# Patient Record
Sex: Female | Born: 1985 | Race: Black or African American | Hispanic: No | Marital: Single | Smoking: Former smoker
Health system: Southern US, Community
[De-identification: ages and names within clinical notes are randomized; demographics above are authoritative.]

## PROBLEM LIST (undated history)

## (undated) DIAGNOSIS — G510 Bell's palsy: Secondary | ICD-10-CM

## (undated) DIAGNOSIS — K219 Gastro-esophageal reflux disease without esophagitis: Secondary | ICD-10-CM

## (undated) HISTORY — DX: Morbid (severe) obesity due to excess calories: E66.01

## (undated) HISTORY — PX: FOOT SURGERY: SHX648

---

## 2008-12-14 ENCOUNTER — Emergency Department (HOSPITAL_COMMUNITY): Admission: EM | Admit: 2008-12-14 | Discharge: 2008-12-14 | Payer: Self-pay | Admitting: Emergency Medicine

## 2009-01-19 ENCOUNTER — Emergency Department (HOSPITAL_COMMUNITY): Admission: EM | Admit: 2009-01-19 | Discharge: 2009-01-19 | Payer: Self-pay | Admitting: Emergency Medicine

## 2010-07-25 ENCOUNTER — Emergency Department (HOSPITAL_COMMUNITY)
Admission: EM | Admit: 2010-07-25 | Discharge: 2010-07-25 | Disposition: A | Discharge status: Home / Self Care | Payer: Self-pay | Attending: Emergency Medicine | Admitting: Emergency Medicine

## 2010-07-25 DIAGNOSIS — J029 Acute pharyngitis, unspecified: Secondary | ICD-10-CM | POA: Insufficient documentation

## 2010-07-25 DIAGNOSIS — F17200 Nicotine dependence, unspecified, uncomplicated: Secondary | ICD-10-CM | POA: Insufficient documentation

## 2010-07-25 DIAGNOSIS — K089 Disorder of teeth and supporting structures, unspecified: Secondary | ICD-10-CM | POA: Insufficient documentation

## 2010-12-05 ENCOUNTER — Encounter: Payer: Self-pay | Admitting: *Deleted

## 2010-12-05 ENCOUNTER — Emergency Department (HOSPITAL_COMMUNITY)
Admission: EM | Admit: 2010-12-05 | Discharge: 2010-12-05 | Disposition: A | Discharge status: Home / Self Care | Payer: Self-pay | Attending: Emergency Medicine | Admitting: Emergency Medicine

## 2010-12-05 DIAGNOSIS — J40 Bronchitis, not specified as acute or chronic: Secondary | ICD-10-CM | POA: Insufficient documentation

## 2010-12-05 DIAGNOSIS — R509 Fever, unspecified: Secondary | ICD-10-CM | POA: Insufficient documentation

## 2010-12-05 DIAGNOSIS — J029 Acute pharyngitis, unspecified: Secondary | ICD-10-CM | POA: Insufficient documentation

## 2010-12-05 DIAGNOSIS — F17200 Nicotine dependence, unspecified, uncomplicated: Secondary | ICD-10-CM | POA: Insufficient documentation

## 2010-12-05 DIAGNOSIS — R059 Cough, unspecified: Secondary | ICD-10-CM | POA: Insufficient documentation

## 2010-12-05 DIAGNOSIS — R05 Cough: Secondary | ICD-10-CM | POA: Insufficient documentation

## 2010-12-05 LAB — RAPID STREP SCREEN (MED CTR MEBANE ONLY): Streptococcus, Group A Screen (Direct): NEGATIVE

## 2010-12-05 MED ORDER — GUAIFENESIN-CODEINE 100-10 MG/5ML PO SYRP: ORAL_SOLUTION | ORAL | Status: DC

## 2010-12-05 NOTE — ED Notes (Signed)
Pt c/o nasal congestion, non productive cough, sore throat, and chest pain with coughing x 2 days. States that her nasal discharge is yellow. Pt alert and oriented x 3. Skin warm and dry. Color pink. Breath sounds clear and equal bilaterally.

## 2010-12-05 NOTE — ED Provider Notes (Signed)
History     CSN: [id number] Arrival date & time: 12/05/2010  9:49 AM  Chief Complaint  Patient presents with  . Sore Throat    (Consider location/radiation/quality/duration/timing/severity/associated sxs/prior treatment) HPI Comments: Pt has had cough x 2 days but refuses a chest xray.  Patient is a 25 y.o. female presenting with pharyngitis. The history is provided by the patient. No language interpreter was used.  Sore Throat This is a new problem. Episode onset: 2 days ago. The problem occurs constantly. The problem has been unchanged. Associated symptoms include coughing, a fever and a sore throat. Associated symptoms comments: Prod cough. The symptoms are aggravated by coughing and swallowing. She has tried acetaminophen (cough medicine) for the symptoms. The treatment provided mild relief.  Sore Throat This is a new problem. Episode onset: 2 days ago. The problem occurs constantly. The problem has been unchanged. Associated symptoms comments: Prod cough. The symptoms are aggravated by coughing and swallowing. She has tried acetaminophen (cough medicine) for the symptoms. The treatment provided mild relief.    History reviewed. No pertinent past medical history.  History reviewed. No pertinent past surgical history.  History reviewed. No pertinent family history.  History  Substance Use Topics  . Smoking status: Current Everyday Smoker    Types: Cigarettes  . Smokeless tobacco: Not on file  . Alcohol Use: Yes     weekly    OB History    Grav Para Term Preterm Abortions TAB SAB Ect Mult Living                  Review of Systems  Constitutional: Positive for fever.  HENT: Positive for sore throat.   Respiratory: Positive for cough. Negative for wheezing.   All other systems reviewed and are negative.    Allergies  Tramadol  Home Medications  No current outpatient prescriptions on file.  BP 120/83  Pulse 97  Temp(Src) 98.7 F (37.1 C) (Oral)  Resp 18  Ht  5\' 9"  (1.753 m)  Wt 265 lb (120.203 kg)  BMI 39.13 kg/m2  SpO2 99%  LMP 11/22/2010  Physical Exam  Nursing note and vitals reviewed. Constitutional: She is oriented to person, place, and time. Vital signs are normal. She appears well-developed and well-nourished. She is cooperative. No distress.  HENT:  Head: Normocephalic and atraumatic. No trismus in the jaw.  Right Ear: External ear normal.  Left Ear: External ear normal.  Nose: Nose normal.  Mouth/Throat: Mucous membranes are normal. No oral lesions. No uvula swelling. Posterior oropharyngeal erythema present. No oropharyngeal exudate, posterior oropharyngeal edema or tonsillar abscesses.       Pharynx is erythematous.  No tonsillar swelling.  Eyes: Conjunctivae and EOM are normal. Pupils are equal, round, and reactive to light. Right eye exhibits no discharge. Left eye exhibits no discharge. No scleral icterus.  Neck: Normal range of motion. Neck supple. No JVD present. No tracheal deviation present. No thyromegaly present.  Cardiovascular: Normal rate, regular rhythm, normal heart sounds, intact distal pulses and normal pulses.  Exam reveals no gallop and no friction rub.   No murmur heard. Pulmonary/Chest: Effort normal and breath sounds normal. No stridor. No respiratory distress. She has no decreased breath sounds. She has no wheezes. She has no rales. She exhibits no tenderness.  Abdominal: Soft. Normal appearance and bowel sounds are normal. She exhibits no distension and no mass. There is no tenderness. There is no rebound and no guarding.  Musculoskeletal: Normal range of motion. She exhibits no edema  and no tenderness.  Lymphadenopathy:    She has no cervical adenopathy.  Neurological: She is alert and oriented to person, place, and time. She has normal reflexes. Coordination normal. GCS eye subscore is 4. GCS verbal subscore is 5. GCS motor subscore is 6.  Skin: Skin is warm and dry. No rash noted. She is not diaphoretic.    Psychiatric: She has a normal mood and affect. Her speech is normal and behavior is normal. Judgment and thought content normal. Cognition and memory are normal.    ED Course  Procedures (including critical care time)   Labs Reviewed  RAPID STREP SCREEN   No results found.   No diagnosis found.  Medical screening examination/treatment/procedure(s) were performed by non-physician practitioner and as supervising physician I was immediately available for consultation/collaboration.   MDM          Worthy Rancher, PA 12/05/10 1122  Suzi Roots, MD 12/06/10 [phone]

## 2010-12-05 NOTE — Discharge Instructions (Signed)
Bronchitis Bronchitis is the body's way of reacting to injury and/or infection (inflammation) of the bronchi. Bronchi are the air tubes that extend from the windpipe into the lungs. If the inflammation becomes severe, it may cause shortness of breath.  CAUSES Inflammation may be caused by:  A virus.   Germs (bacteria).   Dust.   Allergens.   Pollutants and many other irritants.  The cells lining the bronchial tree are covered with tiny hairs (cilia). These constantly beat upward, away from the lungs, toward the mouth. This keeps the lungs free of pollutants. When these cells become too irritated and are unable to do their job, mucus begins to develop. This causes the characteristic cough of bronchitis. The cough clears the lungs when the cilia are unable to do their job. Without either of these protective mechanisms, the mucus would settle in the lungs. Then you would develop pneumonia. Smoking is a common cause of bronchitis and can contribute to pneumonia. Stopping this habit is the single most important thing you can do to help yourself. TREATMENT  Your caregiver may prescribe an antibiotic if the cough is caused by bacteria. Also, medicines that open up your airways make it easier to breathe. Your caregiver may also recommend or prescribe an expectorant. It will loosen the mucus to be coughed up. Only take over-the-counter or prescription medicines for pain, discomfort, or fever as directed by your caregiver.   Removing whatever causes the problem (smoking, for example) is critical to preventing the problem from getting worse.   Cough suppressants may be prescribed for relief of cough symptoms.   Inhaled medicines may be prescribed to help with symptoms now and to help prevent problems from returning.   For those with recurrent (chronic) bronchitis, there may be a need for steroid medicines.  SEEK IMMEDIATE MEDICAL CARE IF:  During treatment, you develop more pus-like mucus  (purulent sputum).   You or your child has an oral temperature above 101, not controlled by medicine.   Your baby is older than 3 months with a rectal temperature of 102 F (38.9 C) or higher.   Your baby is 5 months old or younger with a rectal temperature of 100.4 F (38 C) or higher.   You become progressively more ill.   You have increased difficulty breathing, wheezing, or shortness of breath.  It is necessary to seek immediate medical care if you are elderly or sick from any other disease. MAKE SURE YOU:  Understand these instructions.   Will watch your condition.   Will get help right away if you are not doing well or get worse.  Document Released: 02/12/2005 Document Re-Released: 05/09/2009 [facility] Patient Information 2011 [place], [place].Pharyngitis (Viral and Bacterial) Pharyngitis is soreness (inflammation) or infection of the pharynx. It is also called a sore throat. CAUSES Most sore throats are caused by viruses and are part of a cold. However, some sore throats are caused by strep and other bacteria. Sore throats can also be caused by post nasal drip from draining sinuses, allergies and sometimes from sleeping with an open mouth. Infectious sore throats can be spread from person to person by coughing, sneezing and sharing cups or eating utensils. TREATMENT Sore throats that are viral usually last 3-4 days. Viral illness will get better without medications (antibiotics). Strep throat and other bacterial infections will usually begin to get better about 24-48 hours after you begin to take antibiotics. HOME CARE INSTRUCTIONS  If the caregiver feels there is a bacterial infection or  if there is a positive strep test, they will prescribe an antibiotic. The full course of antibiotics must be taken!! If the full course of antibiotic is not taken, you or your child may become ill again. If you or your child has strep throat and do not finish all of the medication, serious heart  or kidney diseases may develop.   Drink enough water and fluids to keep your urine clear or pale yellow.   Only take over-the-counter or prescription medicines for pain, discomfort or fever as directed by your caregiver.   Get lots of rest.   Gargle with salt water ( tsp. of salt in a glass of water) as often as every 1-2 hours as you need for comfort.   Hard candies may soothe the throat if individual is not at risk for choking. Throat sprays or lozenges may also be used.  SEEK MEDICAL CARE IF:  Large, tender lumps in the neck develop.   A rash develops.   Green, yellow-brown or bloody sputum is coughed up.   You or your child has an oral temperature above 102 F (38.9 C).   Your baby is older than 3 months with a rectal temperature of 100.5 F (38.1 C) or higher for more than 1 day.  SEEK IMMEDIATE MEDICAL CARE IF:  A stiff neck develops.   You or your child are drooling or unable to swallow liquids.   You or your child are vomiting, unable to keep medications or liquids down.   You or your child has severe pain, unrelieved with recommended medications.   You or your child are having difficulty breathing (not due to stuffy nose).   You or your child are unable to fully open your mouth.   You or your child develop redness, swelling, or severe pain anywhere on the neck.   You or your child has an oral temperature above 102 F (38.9 C), not controlled by medicine.   Your baby is older than 3 months with a rectal temperature of 102 F (38.9 C) or higher.   Your baby is 63 months old or younger with a rectal temperature of 100.4 F (38 C) or higher.  MAKE SURE YOU:   Understand these instructions.   Will watch your condition.   Will get help right away if you are not doing well or get worse.  Document Released: 02/12/2005 Document Re-Released: 08/02/2009 [facility] [place] [facility] Patient Information 2011 [place], [place].       The strep screen is negative.  Clinically you  have a bronchitis.  Take tylenol up to 1000 mg every 4 hrs or ibuprofen up to 800 mg every 8 hrs for fever or discomfort.  Gargle frequently with salt water.  Chloraseptic will also help.  Take the cough medicine as diredred

## 2010-12-05 NOTE — ED Notes (Signed)
Pt c/o sore throat, cough, nasal congestion and chest pain with coughing x 2 days.

## 2011-02-05 ENCOUNTER — Encounter (HOSPITAL_COMMUNITY): Payer: Self-pay | Admitting: Emergency Medicine

## 2011-02-05 ENCOUNTER — Emergency Department (HOSPITAL_COMMUNITY)
Admission: EM | Admit: 2011-02-05 | Discharge: 2011-02-05 | Disposition: A | Discharge status: Home / Self Care | Payer: Self-pay | Attending: Emergency Medicine | Admitting: Emergency Medicine

## 2011-02-05 DIAGNOSIS — S63601A Unspecified sprain of right thumb, initial encounter: Secondary | ICD-10-CM

## 2011-02-05 DIAGNOSIS — S6390XA Sprain of unspecified part of unspecified wrist and hand, initial encounter: Secondary | ICD-10-CM | POA: Insufficient documentation

## 2011-02-05 DIAGNOSIS — J101 Influenza due to other identified influenza virus with other respiratory manifestations: Secondary | ICD-10-CM

## 2011-02-05 DIAGNOSIS — J111 Influenza due to unidentified influenza virus with other respiratory manifestations: Secondary | ICD-10-CM | POA: Insufficient documentation

## 2011-02-05 DIAGNOSIS — X58XXXA Exposure to other specified factors, initial encounter: Secondary | ICD-10-CM | POA: Insufficient documentation

## 2011-02-05 DIAGNOSIS — F17200 Nicotine dependence, unspecified, uncomplicated: Secondary | ICD-10-CM | POA: Insufficient documentation

## 2011-02-05 HISTORY — DX: Bell's palsy: G51.0

## 2011-02-05 MED ORDER — OSELTAMIVIR PHOSPHATE 75 MG PO CAPS: 75.0000 mg | ORAL_CAPSULE | Freq: Two times a day (BID) | ORAL | Status: AC

## 2011-02-05 NOTE — ED Notes (Signed)
Rick Miller, PA at bedside. 

## 2011-02-05 NOTE — ED Notes (Signed)
Patient also c/o right thumb pain after falling this morning.

## 2011-02-05 NOTE — ED Notes (Signed)
Patient c/o cough,  fever, nasal congestion, generalized pain, and bilateral ear pain since yesterday. Per patient fever 101 last night. Patient reports taking robitussin last night.

## 2011-02-05 NOTE — ED Provider Notes (Signed)
Medical screening examination/treatment/procedure(s) were performed by non-physician practitioner and as supervising physician I was immediately available for consultation/collaboration.   Benny Lennert, MD 02/05/11 [phone]

## 2011-02-05 NOTE — ED Notes (Signed)
Patient with no complaints at this time. Respirations even and unlabored. Skin warm/dry. Discharge instructions reviewed with patient at this time. Patient given opportunity to voice concerns/ask questions. Patient discharged at this time and left Emergency Department with steady gait.   

## 2011-02-05 NOTE — ED Provider Notes (Signed)
History     CSN: [id number] Arrival date & time: 02/05/2011  9:18 AM   First MD Initiated Contact with Patient 02/05/11 [phone]      Chief Complaint  Patient presents with  . URI    cough, congestion, sore throat, fevers  . Generalized Body Aches  . Otalgia    (Consider location/radiation/quality/duration/timing/severity/associated sxs/prior treatment) Patient is a 25 y.o. female presenting with URI and ear pain. The history is provided by the patient. No language interpreter was used.  URI The primary symptoms include fever, headaches, ear pain, sore throat, cough and myalgias. The current episode started yesterday. This is a new problem.  Symptoms associated with the illness include chills, congestion and rhinorrhea.  Otalgia Associated symptoms include headaches, rhinorrhea, sore throat and cough.    Past Medical History  Diagnosis Date  . Bell's palsy     Past Surgical History  Procedure Date  . Foot surgery     right    Family History  Problem Relation Age of Onset  . Thyroid disease Mother   . Cancer Mother     History  Substance Use Topics  . Smoking status: Current Everyday Smoker -- 0.5 packs/day for 8 years    Types: Cigarettes  . Smokeless tobacco: Never Used  . Alcohol Use: 2.4 oz/week    4 Cans of beer per week    OB History    Grav Para Term Preterm Abortions TAB SAB Ect Mult Living            0      Review of Systems  Constitutional: Positive for fever and chills.  HENT: Positive for ear pain, congestion, sore throat and rhinorrhea.   Respiratory: Positive for cough.   Musculoskeletal: Positive for myalgias.       Thumb injury.  Neurological: Positive for headaches.  All other systems reviewed and are negative.    Allergies  Tramadol  Home Medications   Current Outpatient Rx  Name Route Sig Dispense Refill  . GUAIFENESIN-CODEINE 100-10 MG/5ML PO SYRP  5-10 ml q 4 -6  Hrs prn cough 240 mL 0  . PSEUDOEPH-DOXYLAMINE-DM-APAP  60-7.07-25-998 MG/30ML PO LIQD Oral Take 30 mLs by mouth daily as needed. For cough       BP 116/76  Pulse 86  Temp(Src) 98.4 F (36.9 C) (Oral)  Resp 20  Ht 5\' 9"  (1.753 m)  Wt 260 lb (117.935 kg)  BMI 38.40 kg/m2  SpO2 100%  LMP 01/27/2011  Physical Exam  Nursing note and vitals reviewed. Constitutional: She is oriented to person, place, and time. She appears well-developed and well-nourished. She is cooperative. She appears ill. No distress.  HENT:  Head: Normocephalic and atraumatic.  Nose: Rhinorrhea present.  Eyes: EOM are normal.  Neck: Trachea normal, normal range of motion and phonation normal. Normal carotid pulses, no hepatojugular reflux and no JVD present. No tracheal tenderness present. Carotid bruit is not present. No tracheal deviation present.  Cardiovascular: Normal rate, regular rhythm and normal heart sounds.   Pulmonary/Chest: Effort normal and breath sounds normal. No accessory muscle usage or stridor. Not tachypneic. No respiratory distress. She has no decreased breath sounds. She has no wheezes. She has no rhonchi. She has no rales.  Abdominal: Soft. She exhibits no distension. There is no tenderness.  Musculoskeletal: She exhibits tenderness.       Right shoulder: She exhibits decreased range of motion, tenderness, bony tenderness and pain. She exhibits no swelling, no crepitus, no deformity and no laceration.  Right hand: She exhibits decreased range of motion, tenderness and bony tenderness. She exhibits no laceration and no swelling.       Hands: Neurological: She is alert and oriented to person, place, and time.  Skin: Skin is warm and dry. She is not diaphoretic.  Psychiatric: She has a normal mood and affect. Judgment normal.    ED Course  Procedures (including critical care time)  Labs Reviewed - No data to display No results found.   No diagnosis found.    MDM  Pt refuses xrays of her R thumb.  She simply wants a  brace.        Worthy Rancher, PA 02/05/11 [phone]

## 2013-05-28 ENCOUNTER — Ambulatory Visit (INDEPENDENT_AMBULATORY_CARE_PROVIDER_SITE_OTHER): Payer: 59 | Admitting: Family Medicine

## 2013-05-28 ENCOUNTER — Encounter (INDEPENDENT_AMBULATORY_CARE_PROVIDER_SITE_OTHER): Payer: Self-pay

## 2013-05-28 ENCOUNTER — Telehealth: Payer: Self-pay | Admitting: Family Medicine

## 2013-05-28 ENCOUNTER — Encounter: Payer: Self-pay | Admitting: Family Medicine

## 2013-05-28 VITALS — BP 116/77 | HR 87 | Temp 98.4°F | Ht 68.0 in | Wt 292.0 lb

## 2013-05-28 DIAGNOSIS — R3915 Urgency of urination: Secondary | ICD-10-CM

## 2013-05-28 DIAGNOSIS — N390 Urinary tract infection, site not specified: Secondary | ICD-10-CM

## 2013-05-28 DIAGNOSIS — A499 Bacterial infection, unspecified: Secondary | ICD-10-CM

## 2013-05-28 DIAGNOSIS — N760 Acute vaginitis: Secondary | ICD-10-CM

## 2013-05-28 DIAGNOSIS — B9689 Other specified bacterial agents as the cause of diseases classified elsewhere: Secondary | ICD-10-CM

## 2013-05-28 LAB — POCT URINALYSIS DIPSTICK
Bilirubin, UA: NEGATIVE
Glucose, UA: NEGATIVE
Ketones, UA: NEGATIVE
Nitrite, UA: NEGATIVE
Protein, UA: NEGATIVE
Spec Grav, UA: >=1.03
Urobilinogen, UA: NEGATIVE
pH, UA: 6.5

## 2013-05-28 LAB — POCT UA - MICROSCOPIC ONLY
Casts, Ur, LPF, POC: NEGATIVE
Crystals, Ur, HPF, POC: NEGATIVE
Mucus, UA: NEGATIVE
Yeast, UA: NEGATIVE

## 2013-05-28 MED ORDER — CIPROFLOXACIN HCL 500 MG PO TABS: 500.0000 mg | ORAL_TABLET | Freq: Two times a day (BID) | ORAL | Status: DC

## 2013-05-28 MED ORDER — METRONIDAZOLE 0.75 % VA GEL: VAGINAL | Status: DC

## 2013-05-28 NOTE — Telephone Encounter (Signed)
appt today at 3:30.

## 2013-05-28 NOTE — Progress Notes (Signed)
   Subjective:    Patient ID: Elizabeth Luna, female    DOB: 07-04-1985, 28 y.o.   MRN: [id number]  HPI C/o urinary frequency and urgency. She has been having some sx's for 2 days now.   Review of Systems C/o urinary frequency and urgency. No chest pain, SOB, HA, dizziness, vision change, N/V, diarrhea, constipation, myalgias, arthralgias or rash.     Objective:   Physical Exam  Vital signs noted  Well developed well nourished female.  HEENT - Head atraumatic Normocephalic                Eyes - PERRLA, Conjuctiva - clear Sclera- Clear EOMI                Ears - EAC's Wnl TM's Wnl Gross Hearing WNL                 Throat - oropharanx wnl Respiratory - Lungs CTA bilateral Cardiac - RRR S1 and S2 without murmur GI - Abdomen soft Nontender and bowel sounds active x 4  Results for orders placed in visit on 05/28/13  POCT UA - MICROSCOPIC ONLY      Result Value Ref Range   WBC, Ur, HPF, POC 5-10w/ clue cells     RBC, urine, microscopic 1-5     Bacteria, U Microscopic mod     Mucus, UA neg     Epithelial cells, urine per micros mod     Crystals, Ur, HPF, POC neg     Casts, Ur, LPF, POC neg     Yeast, UA neg    POCT URINALYSIS DIPSTICK      Result Value Ref Range   Color, UA yellow     Clarity, UA clear     Glucose, UA neg     Bilirubin, UA neg     Ketones, UA neg     Spec Grav, UA >=1.030     Blood, UA trace     pH, UA 6.5     Protein, UA neg     Urobilinogen, UA negative     Nitrite, UA neg     Leukocytes, UA Trace          Assessment & Plan:  Urinary urgency - Plan: POCT UA - Microscopic Only, POCT urinalysis dipstick, ciprofloxacin (CIPRO) 500 MG tablet, Urine culture, metroNIDAZOLE (METROGEL VAGINAL) 0.75 % vaginal gel  UTI (lower urinary tract infection) - Plan: ciprofloxacin (CIPRO) 500 MG tablet, Urine culture, metroNIDAZOLE (METROGEL VAGINAL) 0.75 % vaginal gel  Bacterial vaginitis - Plan: metroNIDAZOLE (METROGEL VAGINAL) 0.75 % vaginal gel  Deatra CanterWilliam J  Sarah Baez FNP

## 2013-05-29 LAB — URINE CULTURE

## 2013-06-19 ENCOUNTER — Telehealth: Payer: Self-pay | Admitting: Family Medicine

## 2013-06-19 ENCOUNTER — Ambulatory Visit: Payer: 59 | Admitting: Family Medicine

## 2013-06-19 NOTE — Telephone Encounter (Signed)
Appt scheduled and patient aware.  

## 2013-06-20 ENCOUNTER — Ambulatory Visit (INDEPENDENT_AMBULATORY_CARE_PROVIDER_SITE_OTHER): Payer: 59 | Admitting: Family Medicine

## 2013-06-20 ENCOUNTER — Emergency Department (HOSPITAL_COMMUNITY)
Admission: EM | Admit: 2013-06-20 | Discharge: 2013-06-20 | Disposition: A | Discharge status: Home / Self Care | Payer: 59 | Attending: Emergency Medicine | Admitting: Emergency Medicine

## 2013-06-20 ENCOUNTER — Encounter: Payer: Self-pay | Admitting: Family Medicine

## 2013-06-20 ENCOUNTER — Encounter (HOSPITAL_COMMUNITY): Payer: Self-pay | Admitting: Emergency Medicine

## 2013-06-20 ENCOUNTER — Emergency Department (HOSPITAL_COMMUNITY): Payer: 59

## 2013-06-20 VITALS — BP 137/66 | HR 79 | Temp 97.2°F | Ht 68.0 in | Wt 292.0 lb

## 2013-06-20 DIAGNOSIS — Z3202 Encounter for pregnancy test, result negative: Secondary | ICD-10-CM | POA: Insufficient documentation

## 2013-06-20 DIAGNOSIS — R110 Nausea: Secondary | ICD-10-CM | POA: Insufficient documentation

## 2013-06-20 DIAGNOSIS — Z8669 Personal history of other diseases of the nervous system and sense organs: Secondary | ICD-10-CM | POA: Insufficient documentation

## 2013-06-20 DIAGNOSIS — F17200 Nicotine dependence, unspecified, uncomplicated: Secondary | ICD-10-CM | POA: Insufficient documentation

## 2013-06-20 DIAGNOSIS — R1011 Right upper quadrant pain: Secondary | ICD-10-CM | POA: Insufficient documentation

## 2013-06-20 DIAGNOSIS — M549 Dorsalgia, unspecified: Secondary | ICD-10-CM | POA: Insufficient documentation

## 2013-06-20 DIAGNOSIS — K812 Acute cholecystitis with chronic cholecystitis: Secondary | ICD-10-CM | POA: Insufficient documentation

## 2013-06-20 DIAGNOSIS — R109 Unspecified abdominal pain: Secondary | ICD-10-CM

## 2013-06-20 HISTORY — DX: Gastro-esophageal reflux disease without esophagitis: K21.9

## 2013-06-20 LAB — COMPREHENSIVE METABOLIC PANEL
ALK PHOS: 86 U/L (ref 39–117)
ALT: 12 U/L (ref 0–35)
AST: 13 U/L (ref 0–37)
Albumin: 4 g/dL (ref 3.5–5.2)
BILIRUBIN TOTAL: 0.4 mg/dL (ref 0.3–1.2)
BUN: 10 mg/dL (ref 6–23)
CHLORIDE: 102 meq/L (ref 96–112)
CO2: 25 meq/L (ref 19–32)
CREATININE: 0.65 mg/dL (ref 0.50–1.10)
Calcium: 9.5 mg/dL (ref 8.4–10.5)
GFR calc Af Amer: >90 mL/min (ref >90)
Glucose, Bld: 86 mg/dL (ref 70–99)
POTASSIUM: 4.3 meq/L (ref 3.7–5.3)
Sodium: 139 mEq/L (ref 137–147)
Total Protein: 7.3 g/dL (ref 6.0–8.3)

## 2013-06-20 LAB — CBC WITH DIFFERENTIAL/PLATELET
Basophils Absolute: 0.1 10*3/uL (ref 0.0–0.1)
Basophils Relative: 2 % — ABNORMAL HIGH (ref 0–1)
Eosinophils Absolute: 0.2 10*3/uL (ref 0.0–0.7)
Eosinophils Relative: 5 % (ref 0–5)
HEMATOCRIT: 39.9 % (ref 36.0–46.0)
HEMOGLOBIN: 13.7 g/dL (ref 12.0–15.0)
LYMPHS ABS: 1.6 10*3/uL (ref 0.7–4.0)
LYMPHS PCT: 34 % (ref 12–46)
MCH: 29.7 pg (ref 26.0–34.0)
MCHC: 34.3 g/dL (ref 30.0–36.0)
MCV: 86.4 fL (ref 78.0–100.0)
MONO ABS: 0.6 10*3/uL (ref 0.1–1.0)
MONOS PCT: 13 % — AB (ref 3–12)
NEUTROS ABS: 2.2 10*3/uL (ref 1.7–7.7)
Neutrophils Relative %: 46 % (ref 43–77)
Platelets: 299 10*3/uL (ref 150–400)
RBC: 4.62 MIL/uL (ref 3.87–5.11)
RDW: 14 % (ref 11.5–15.5)
WBC: 4.6 10*3/uL (ref 4.0–10.5)

## 2013-06-20 LAB — PREGNANCY, URINE: Preg Test, Ur: NEGATIVE

## 2013-06-20 LAB — URINALYSIS, ROUTINE W REFLEX MICROSCOPIC
Bilirubin Urine: NEGATIVE
GLUCOSE, UA: NEGATIVE mg/dL
Hgb urine dipstick: NEGATIVE
KETONES UR: NEGATIVE mg/dL
LEUKOCYTES UA: NEGATIVE
Nitrite: NEGATIVE
PH: 7 (ref 5.0–8.0)
Protein, ur: NEGATIVE mg/dL
SPECIFIC GRAVITY, URINE: 1.02 (ref 1.005–1.030)
Urobilinogen, UA: 0.2 mg/dL (ref 0.0–1.0)

## 2013-06-20 LAB — POCT URINALYSIS DIPSTICK
Bilirubin, UA: NEGATIVE
Blood, UA: NEGATIVE
Glucose, UA: NEGATIVE
Ketones, UA: NEGATIVE
Leukocytes, UA: NEGATIVE
Nitrite, UA: NEGATIVE
Protein, UA: NEGATIVE
Spec Grav, UA: 1.015
Urobilinogen, UA: 0.2
pH, UA: 5

## 2013-06-20 LAB — LIPASE, BLOOD: LIPASE: 22 U/L (ref 11–59)

## 2013-06-20 MED ORDER — PROMETHAZINE HCL 12.5 MG PO TABS: 12.5000 mg | ORAL_TABLET | Freq: Four times a day (QID) | ORAL | Status: DC | PRN

## 2013-06-20 MED ORDER — HYDROCODONE-ACETAMINOPHEN 5-325 MG PO TABS: 1.0000 | ORAL_TABLET | Freq: Four times a day (QID) | ORAL | Status: DC | PRN

## 2013-06-20 MED ORDER — FAMOTIDINE 20 MG PO TABS: 20.0000 mg | ORAL_TABLET | Freq: Two times a day (BID) | ORAL | Status: DC

## 2013-06-20 NOTE — ED Notes (Signed)
Pt c/o mid abd pain that was intermittent at first but has remained constant for the past week, worse after eating requiring her to have a bowel movement immediatly after eating and nausea, was seen by her PCP today and was told to come to er for further work up, pt was eating chips in waiting room when RN called pt for triage, importance of not eating explained to pt and pt expressed understanding,

## 2013-06-20 NOTE — ED Provider Notes (Signed)
Medical screening examination/treatment/procedure(s) were conducted as a shared visit with non-physician practitioner(s) and myself.  I personally evaluated the patient during the encounter.   EKG Interpretation None     No acute abdomen. Ultrasound reveals numerous gallstones. Will recommend surgical followup your  Donnetta HutchingBrian Torah Pinnock, MD 06/20/13 [phone]

## 2013-06-20 NOTE — Progress Notes (Signed)
Patient ID: Elizabeth Luna, female   DOB: 08-19-1985, 28 y.Luna.   MRN: [id number] SUBJECTIVE: CC: Chief Complaint  Patient presents with  . Acute Visit    was seen on 05-28-13 by Elizabeth Luna for UTI completed antibiotic  c/Luna abd pain and says when get jup from chair and straightens up have pain "really bad mid epigastric area  and when eats "I have to go straight to bathroom     HPI: Symptoms as above.symptoms started a while ago and comes and goes.has to walk bent over. Works 3 jobs.last 1 week it got worse.when she eats she immediately has to go to the bathroom for a BM. It comes out like water.has had acid reflux . Sometime the acid reflux is bad.having bad pain yesterday at work.  Past Medical History  Diagnosis Date  . Bell's palsy    Past Surgical History  Procedure Laterality Date  . Foot surgery      right   History   Social History  . Marital Status: Single    Spouse Name: N/A    Number of Children: N/A  . Years of Education: N/A   Occupational History  . Not on file.   Social History Main Topics  . Smoking status: Current Every Day Smoker -- 0.50 packs/day for 8 years    Types: Cigarettes  . Smokeless tobacco: Never Used  . Alcohol Use: 2.4 oz/week    4 Cans of beer per week  . Drug Use: 1.00 per week    Special: Marijuana  . Sexual Activity: Yes    Birth Control/ Protection: None   Other Topics Concern  . Not on file   Social History Narrative  . No narrative on file   Family History  Problem Relation Age of Onset  . Thyroid disease Mother   . Cancer Mother    Current Outpatient Prescriptions on File Prior to Visit  Medication Sig Dispense Refill  . ciprofloxacin (CIPRO) 500 MG tablet Take 1 tablet (500 mg total) by mouth 2 (two) times daily.  14 tablet  0  . metroNIDAZOLE (METROGEL VAGINAL) 0.75 % vaginal gel Place one applicator vaginally qhs x 5 days  70 g  0   No current facility-administered medications on file prior to visit.   Allergies  Allergen  Reactions  . Tramadol     There is no immunization history on file for this patient. Prior to Admission medications   Medication Sig Start Date End Date Taking? Authorizing Provider  ciprofloxacin (CIPRO) 500 MG tablet Take 1 tablet (500 mg total) by mouth 2 (two) times daily. 05/28/13   Elizabeth CanterWilliam J Oxford, Elizabeth Luna  metroNIDAZOLE (METROGEL VAGINAL) 0.75 % vaginal gel Place one applicator vaginally qhs x 5 days 05/28/13   Elizabeth CanterWilliam J Oxford, Elizabeth Luna     ROS: As above in the HPI. All other systems are stable or negative.  OBJECTIVE: APPEARANCE:  Patient in no acute distress.The patient appeared well nourished and normally developed. Acyanotic. Waist: VITAL SIGNS:BP 137/66  Pulse 79  Temp(Src) 97.2 F (36.2 C) (Oral)  Ht 5\' 8"  (1.727 m)  Wt 292 lb (132.45 kg)  BMI 44.41 kg/m2  LMP 06/15/2013 [name] Morbidly obese.   SKIN: warm and  Dry without overt rashes, tattoos and scars  HEAD and Neck: without JVD, Head and scalp: normal Eyes:No scleral icterus. Fundi normal, eye movements normal. Ears: Auricle normal, canal normal, Tympanic membranes normal, insufflation normal. Nose: normal Throat: normal Neck & thyroid: normal  CHEST & LUNGS:  Chest wall: normal Lungs: Clear  CVS: Reveals the PMI to be normally located. Regular rhythm, First and Second Heart sounds are normal,  absence of murmurs, rubs or gallops. Peripheral vasculature: Radial pulses: normal Dorsal pedis pulses: normal Posterior pulses: normal  ABDOMEN:  Appearance: obese Very tender in the epigastrium,RUQ and umbilicus.mostly tender exquisitely at the umbilicus. , no organomegaly, no masses, no Abdominal Aortic enlargement. No Guarding , no rebound. No Bruits. Bowel sounds: normal  RECTAL: deferred. GU: N/A  EXTREMETIES: nonedematous.  MUSCULOSKELETAL:  Spine: normal Joints: intact  NEUROLOGIC: oriented to time,place and person; nonfocal.  ASSESSMENT: Abdominal pain, unspecified site - Plan: POCT  urinalysis dipstick Etiology to be determined. Pain is  Significant where patient has to walk slightly flexed forward. Suspect possible causes as GB disease vs PUD vs pancreatitis vs colitis.  PLAN: [facility]  Orders Placed This Encounter  Procedures  . POCT urinalysis dipstick   Results for orders placed in visit on 06/20/13  POCT URINALYSIS DIPSTICK      Result Value Ref Range   Color, UA yellow     Clarity, UA clear     Glucose, UA NEG     Bilirubin, UA NEG     Ketones, UA NEG     Spec Grav, UA 1.015     Blood, UA NEG     pH, UA 5.0     Protein, UA NEG     Urobilinogen, UA 0.2     Nitrite, UA NEG     Leukocytes, UA Negative       No orders of the defined types were placed in this encounter.   There are no discontinued medications. [facility]  Hudson Lehmkuhl P. Modesto CharonWong, M.D.

## 2013-06-20 NOTE — ED Provider Notes (Signed)
CSN: [id number]     Arrival date & time 06/20/13  0910 History   First MD Initiated Contact with Patient 06/20/13 0912     Chief Complaint  Patient presents with  . Abdominal Pain     (Consider location/radiation/quality/duration/timing/severity/associated sxs/prior Treatment) Patient is a 28 y.o. female presenting with abdominal pain. The history is provided by the patient.  Abdominal Pain Pain location:  Epigastric Pain quality: aching and bloating   Associated symptoms: nausea   Associated symptoms: no chest pain, no chills, no cough, no dysuria, no fever, no shortness of breath and no vomiting    Elizabeth Luna is a 28 y.o. feamele who presents to the ED with abdominal pain that started 2 weeks ago. Initially the pain would come and go but the past week the pain is constant. The pain is located in above the umbilicus. The pain increases with eating and radiates to the back. The pain is worse after eating burgers, fries or other fried food. She has had nausea but no vomiting.  Past Medical History  Diagnosis Date  . Bell's palsy   . GERD (gastroesophageal reflux disease)    Past Surgical History  Procedure Laterality Date  . Foot surgery      right   Family History  Problem Relation Age of Onset  . Thyroid disease Mother   . Cancer Mother    History  Substance Use Topics  . Smoking status: Current Every Day Smoker -- 0.50 packs/day for 8 years    Types: Cigarettes  . Smokeless tobacco: Never Used  . Alcohol Use: 2.4 oz/week    4 Cans of beer per week   OB History   Grav Para Term Preterm Abortions TAB SAB Ect Mult Living            0     Review of Systems  Constitutional: Negative for fever and chills.  HENT: Negative.   Eyes: Negative for visual disturbance.  Respiratory: Negative for cough, shortness of breath and wheezing.   Cardiovascular: Negative for chest pain.  Gastrointestinal: Positive for nausea and abdominal pain. Negative for vomiting.   Genitourinary: Negative for dysuria, urgency and frequency.  Musculoskeletal: Positive for back pain.  Skin: Negative for rash.  Neurological: Negative for syncope and headaches.  Psychiatric/Behavioral: Negative for confusion. The patient is not nervous/anxious.       Allergies  Tramadol  Home Medications   Prior to Admission medications   Medication Sig Start Date End Date Taking? Authorizing Provider  acetaminophen (TYLENOL) 500 MG tablet Take 1,000 mg by mouth every 6 (six) hours as needed for mild pain.   Yes Historical Provider, MD  ibuprofen (ADVIL,MOTRIN) 200 MG tablet Take 800 mg by mouth every 6 (six) hours as needed for moderate pain.   Yes Historical Provider, MD   BP 117/79  Pulse 80  Temp(Src) 98.3 F (36.8 C) (Oral)  Resp 21  Ht 5\' 7"  (1.702 m)  Wt 291 lb (131.997 kg)  BMI 45.57 kg/m2  SpO2 99%  LMP 06/15/2013 Physical Exam  Nursing note and vitals reviewed. Constitutional: She is oriented to person, place, and time. She appears well-developed and well-nourished. No distress.  HENT:  Head: Atraumatic.  Eyes: Conjunctivae and EOM are normal.  Neck: Neck supple.  Cardiovascular: Normal rate, regular rhythm and normal heart sounds.   Pulmonary/Chest: Effort normal.  Abdominal: Soft. Bowel sounds are normal. There is tenderness in the right upper quadrant and epigastric area. There is no rebound, no guarding  and no CVA tenderness.  Musculoskeletal: Normal range of motion.  Neurological: She is alert and oriented to person, place, and time. No cranial nerve deficit.  Skin: Skin is warm and dry.  Psychiatric: She has a normal mood and affect. Her behavior is normal.   Results for orders placed during the hospital encounter of 06/20/13 (from the past 24 hour(s))  PREGNANCY, URINE     Status: None   Collection Time    06/20/13  9:40 AM      Result Value Ref Range   Preg Test, Ur NEGATIVE  NEGATIVE  URINALYSIS, ROUTINE W REFLEX MICROSCOPIC     Status: None    Collection Time    06/20/13  9:40 AM      Result Value Ref Range   Color, Urine YELLOW  YELLOW   APPearance CLEAR  CLEAR   Specific Gravity, Urine 1.020  1.005 - 1.030   pH 7.0  5.0 - 8.0   Glucose, UA NEGATIVE  NEGATIVE mg/dL   Hgb urine dipstick NEGATIVE  NEGATIVE   Bilirubin Urine NEGATIVE  NEGATIVE   Ketones, ur NEGATIVE  NEGATIVE mg/dL   Protein, ur NEGATIVE  NEGATIVE mg/dL   Urobilinogen, UA 0.2  0.0 - 1.0 mg/dL   Nitrite NEGATIVE  NEGATIVE   Leukocytes, UA NEGATIVE  NEGATIVE  CBC WITH DIFFERENTIAL     Status: Abnormal   Collection Time    06/20/13 10:15 AM      Result Value Ref Range   WBC 4.6  4.0 - 10.5 K/uL   RBC 4.62  3.87 - 5.11 MIL/uL   Hemoglobin 13.7  12.0 - 15.0 g/dL   HCT 82.939.9  56.236.0 - 13.046.0 %   MCV 86.4  78.0 - 100.0 fL   MCH 29.7  26.0 - 34.0 pg   MCHC 34.3  30.0 - 36.0 g/dL   RDW 86.514.0  78.411.5 - 69.615.5 %   Platelets 299  150 - 400 K/uL   Neutrophils Relative % 46  43 - 77 %   Neutro Abs 2.2  1.7 - 7.7 K/uL   Lymphocytes Relative 34  12 - 46 %   Lymphs Abs 1.6  0.7 - 4.0 K/uL   Monocytes Relative 13 (*) 3 - 12 %   Monocytes Absolute 0.6  0.1 - 1.0 K/uL   Eosinophils Relative 5  0 - 5 %   Eosinophils Absolute 0.2  0.0 - 0.7 K/uL   Basophils Relative 2 (*) 0 - 1 %   Basophils Absolute 0.1  0.0 - 0.1 K/uL  COMPREHENSIVE METABOLIC PANEL     Status: None   Collection Time    06/20/13 10:15 AM      Result Value Ref Range   Sodium 139  137 - 147 mEq/L   Potassium 4.3  3.7 - 5.3 mEq/L   Chloride 102  96 - 112 mEq/L   CO2 25  19 - 32 mEq/L   Glucose, Bld 86  70 - 99 mg/dL   BUN 10  6 - 23 mg/dL   Creatinine, Ser 2.950.65  0.50 - 1.10 mg/dL   Calcium 9.5  8.4 - 28.410.5 mg/dL   Total Protein 7.3  6.0 - 8.3 g/dL   Albumin 4.0  3.5 - 5.2 g/dL   AST 13  0 - 37 U/L   ALT 12  0 - 35 U/L   Alkaline Phosphatase 86  39 - 117 U/L   Total Bilirubin 0.4  0.3 - 1.2 mg/dL   GFR  calc non Af Amer >90  >90 mL/min   GFR calc Af Amer >90  >90 mL/min  LIPASE, BLOOD     Status: None    Collection Time    06/20/13 10:15 AM      Result Value Ref Range   Lipase 22  11 - 59 U/L    ED Course  Procedures  [place] Abdomen Limited Ruq  06/20/2013   CLINICAL DATA:  Right upper quadrant pain after eating fried foods  EXAM: US ABDOMEN LIMITED - RIGHT UPPER QUADRANT  COMPARISON:  None.  FINDINGS: Gallbladder:  The gallbladder lumen is filled with echogenic shadowing foci consistent with cholelithiasis. Per the sonographer, sonographic Eulah PontMurphy sign was negative. No pericholecystic fluid. Gallbladder wall thickness is difficult to measure. There is likely wall thickening to 6 mm.  Common bile duct:  Diameter: Within normal limits at 3.8 mm.  Liver:  No focal lesion identified. Within normal limits in parenchymal echogenicity. Main portal vein is patent with normal hepatopetal flow.  IMPRESSION: Numerous gallstones fill the gallbladder lumen and there is evidence of gallbladder wall thickening. Sonographic findings are most suggestive of cholelithiasis and chronic cholecystitis.   Electronically Signed   By: Malachy MoanHeath  McCullough M.D.   On: 06/20/2013 11:27    MDM  28 y.o. female with RUQ and epigastric pain that has been off and on x 2 months and worse the past week. Will treat for pain and she will start a low fat diet. She will follow up with the surgeon to discuss gallbladder surgery. She will return as needed for worsening symptoms. I have reviewed this patient's vital signs, nurses notes, appropriate labs and imaging.  I have discussed findings and plan of care with the patient and she voices understanding.    Medication List    TAKE these medications       famotidine 20 MG tablet  Commonly known as:  PEPCID  Take 1 tablet (20 mg total) by mouth 2 (two) times daily.     HYDROcodone-acetaminophen 5-325 MG per tablet  Commonly known as:  NORCO  Take 1 tablet by mouth every 6 (six) hours as needed for moderate pain.     promethazine 12.5 MG tablet  Commonly known as:  PHENERGAN  Take 1  tablet (12.5 mg total) by mouth every 6 (six) hours as needed for nausea or vomiting.      ASK your doctor about these medications       acetaminophen 500 MG tablet  Commonly known as:  TYLENOL  Take 1,000 mg by mouth every 6 (six) hours as needed for mild pain.     ibuprofen 200 MG tablet  Commonly known as:  ADVIL,MOTRIN  Take 800 mg by mouth every 6 (six) hours as needed for moderate pain.            [place] M Odilia Dam[place], [place] 06/20/13 [phone]

## 2013-06-20 NOTE — Discharge Instructions (Signed)
Your ultrasound today shows that you have gallstones. Since you are having pain you should avoid greasy foods. You will need to follow up with the surgeon to discuss having your gallbladder taken out.

## 2013-07-02 ENCOUNTER — Encounter (HOSPITAL_COMMUNITY): Payer: Self-pay

## 2013-07-02 ENCOUNTER — Encounter (HOSPITAL_COMMUNITY)
Admission: RE | Admit: 2013-07-02 | Discharge: 2013-07-02 | Disposition: A | Discharge status: Home / Self Care | Payer: 59 | Source: Ambulatory Visit | Attending: General Surgery | Admitting: General Surgery

## 2013-07-02 ENCOUNTER — Encounter (HOSPITAL_COMMUNITY): Payer: Self-pay | Admitting: Pharmacy Technician

## 2013-07-02 LAB — PREGNANCY, URINE: PREG TEST UR: NEGATIVE

## 2013-07-02 NOTE — Pre-Procedure Instructions (Signed)
Patient given information to sign up for my chart at home. 

## 2013-07-02 NOTE — Patient Instructions (Signed)
Elizabeth Luna  07/02/2013   Your procedure is scheduled on:  07/03/2013  [facility] AM.  Call this number if you have problems the morning of surgery: [phone]   Remember:   Do not eat food or drink liquids after midnight.   Take these medicines the morning of surgery with A SIP OF WATER:  Pepcid, hydrocodone, phenergan   Do not wear jewelry, make-up or nail polish.  Do not wear lotions, powders, or perfumes.   Do not shave 48 hours prior to surgery. Men may shave face and neck.  Do not bring valuables to the hospital.  [facility] not [facility]  [facility] any belongings or valuables.               Contacts, dentures or bridgework may not be worn into surgery.  Leave suitcase in the car. After surgery it may be brought to your room.  For patients admitted to the hospital, discharge time is determined by your treatment team.               Patients discharged the day of surgery will not be allowed to drive home.  Name and phone number of your driver: family Special Instructions: Shower using CHG 2 nights before surgery and the night before surgery.  If you shower the day of surgery use CHG.  Use special wash - you have one bottle of CHG for all showers.  You should use approximately 1/3 of the bottle for each shower.   Please read over the following fact sheets that you were given: Pain Booklet, Coughing and Deep Breathing, Surgical Site Infection Prevention, Anesthesia Post-op Instructions and Care and Recovery After Surgery Laparoscopic Cholecystectomy Laparoscopic cholecystectomy is surgery to remove the gallbladder. The gallbladder is located in the upper right part of the abdomen, behind the liver. It is a storage sac for bile produced in the liver. Bile aids in the digestion and absorption of fats. Cholecystectomy is often done for inflammation of the gallbladder (cholecystitis). This condition is usually caused by a buildup of gallstones (cholelithiasis) in  your gallbladder. Gallstones can block the flow of bile, resulting in inflammation and pain. In severe cases, emergency surgery may be required. When emergency surgery is not required, you will have time to prepare for the procedure. Laparoscopic surgery is an alternative to open surgery. Laparoscopic surgery has a shorter recovery time. Your common bile duct may also need to be examined during the procedure. If stones are found in the common bile duct, they may be removed. LET [facility] PROVIDER KNOW ABOUT:  Any allergies you have.  All medicines you are taking, including vitamins, herbs, eye drops, creams, and over-the-counter medicines.  Previous problems you or members of your family have had with the use of anesthetics.  Any blood disorders you have.  Previous surgeries you have had.  Medical conditions you have. RISKS AND COMPLICATIONS Generally, this is a safe procedure. However, as with any procedure, complications can occur. Possible complications include:  Infection.  Damage to the common bile duct, nerves, arteries, veins, or other internal organs such as the stomach, liver, or intestines.  Bleeding.  A stone may remain in the common bile duct.  A bile leak from the cyst duct that is clipped when your gallbladder is removed.  The need to convert to open surgery, which requires a larger incision in the abdomen. This may be necessary if your surgeon thinks it  is not safe to continue with a laparoscopic procedure. BEFORE THE PROCEDURE  Ask your health care provider about changing or stopping any regular medicines. You will need to stop taking aspirin or blood thinners at least 5 days prior to surgery.  Do not eat or drink anything after midnight the night before surgery.  Let your health care provider know if you develop a cold or other infectious problem before surgery. PROCEDURE   You will be given medicine to make you sleep through the procedure (general  anesthetic). A breathing tube will be placed in your mouth.  When you are asleep, your surgeon will make several small cuts (incisions) in your abdomen.  A thin, lighted tube with a tiny camera on the end (laparoscope) is inserted through one of the small incisions. The camera on the laparoscope sends a picture to a TV screen in the operating room. This gives the surgeon a good view inside your abdomen.  A gas will be pumped into your abdomen. This expands your abdomen so that the surgeon has more room to perform the surgery.  Other tools needed for the procedure are inserted through the other incisions. The gallbladder is removed through one of the incisions.  After the removal of your gallbladder, the incisions will be closed with stitches, staples, or skin glue. AFTER THE PROCEDURE  You will be taken to a recovery area where your progress will be checked often.  You may be allowed to go home the same day if your pain is controlled and you can tolerate liquids. Document Released: 02/12/2005 Document Revised: 12/03/2012 Document Reviewed: 09/24/2012 [facility] Patient Information 2014 [place]. PATIENT INSTRUCTIONS POST-ANESTHESIA  IMMEDIATELY FOLLOWING SURGERY:  Do not drive or operate machinery for the first twenty four hours after surgery.  Do not make any important decisions for twenty four hours after surgery or while taking narcotic pain medications or sedatives.  If you develop intractable nausea and vomiting or a severe headache please notify your doctor immediately.  FOLLOW-UP:  Please make an appointment with your surgeon as instructed. You do not need to follow up with anesthesia unless specifically instructed to do so.  WOUND CARE INSTRUCTIONS (if applicable):  Keep a dry clean dressing on the anesthesia/puncture wound site if there is drainage.  Once the wound has quit draining you may leave it open to air.  Generally you should leave the bandage intact for twenty four  hours unless there is drainage.  If the epidural site drains for more than 36-48 hours please call the anesthesia department.  QUESTIONS?:  Please feel free to call your physician or the hospital operator if you have any questions, and they will be happy to assist you.

## 2013-07-02 NOTE — H&P (Signed)
  NTS SOAP Note  Vital Signs:  Vitals as of: 07/02/2013: Systolic 136: Diastolic 86: Heart Rate 79: Temp 97.18F: Height 555ft 8in: Weight 292Lbs 0 Ounces: Pain Level 5: BMI 44.4  BMI : 44.4 kg/m2  Subjective: This [age] Years 2 Months old Female presents for of abdominal pain.  Has been having right upper quadrant abdominal pain with radiation to the right flank, nausea, and fatty food intolerance for some time now.  Seen in ER, found to have choleithiasis.  Normal common bile duct.  LFT's wnl.  Review of Symptoms:  Constitutional:unremarkable   Head:unremarkable    Eyes:unremarkable   Nose/Mouth/Throat:unremarkable Cardiovascular:  unremarkable   Respiratory:  dyspnea Gastrointestin    abdominal pain,nausea,vomiting,heartburn Genitourinary:unremarkable       back pain Skin:unremarkable Hematolgic/Lymphatic:unremarkable     Allergic/Immunologic:unremarkable     Past Medical History:    Reviewed  Past Medical History  Surgical History: unremarkable Medical Problems: obesity, reflux Allergies: tramadol Medications: vicodin, famotidine, promethazine   Social History:Reviewed  Social History  Preferred Language: English Race:  Black or African American Ethnicity: Not Hispanic / Latino Age: 28 Years 2 Months Marital Status:  S Alcohol: rarely Recreational drug(s): no   Smoking Status: Current every day smoker reviewed on 07/02/2013 Started Date:  Packs per day: 0.50 Functional Status reviewed on 07/02/2013 ------------------------------------------------ Bathing: Normal Cooking: Normal Dressing: Normal Driving: Normal Eating: Normal Managing Meds: Normal Oral Care: Normal Shopping: Normal Toileting: Normal Transferring: Normal Walking: Normal Cognitive Status reviewed on 07/02/2013 ------------------------------------------------ Attention: Normal Decision Making: Normal Language: Normal Memory: Normal Motor:  Normal Perception: Normal Problem Solving: Normal Visual and Spatial: Normal   Family History:  Reviewed  Family Health History Mother, Living; Disorder of thyroid gland;  Father, Living; Healthy;     Objective Information: General:  Well appearing, well nourished in no distress.   no scleral icterus Heart:  RRR, no murmur or gallop.  Normal S1, S2.  No S3, S4.  Lungs:    CTA bilaterally, no wheezes, rhonchi, rales.  Breathing unlabored. Abdomen:Soft, tender in right upper quadrant to palpation, ND, normal bowel sounds, no HSM, no masses.  No peritoneal signs.  Assessment:Biliary colic, cholelithiasis  Diagnoses: 574.20 Gallstone (Calculus of gallbladder without cholecystitis without obstruction)  Procedures: [id number] - OFFICE OUTPATIENT NEW 30 MINUTES    Plan:  Scheduled for laparoscopic cholecystectomy on 07/03/13.   Patient Education:Alternative treatments to surgery were discussed with patient (and family).  Risks and benefits  of procedure including bleeding, infection, hepatobiliary injury, and the possibility of an open procedure were fully explained to the patient (and family) who gave informed consent. Patient/family questions were addressed.  Follow-up:Pending Surgery

## 2013-07-03 ENCOUNTER — Ambulatory Visit (HOSPITAL_COMMUNITY): Payer: 59 | Admitting: Anesthesiology

## 2013-07-03 ENCOUNTER — Encounter (HOSPITAL_COMMUNITY)
Admission: RE | Disposition: A | Discharge status: Home / Self Care | Payer: Self-pay | Source: Ambulatory Visit | Attending: General Surgery

## 2013-07-03 ENCOUNTER — Encounter (HOSPITAL_COMMUNITY): Payer: 59 | Admitting: Anesthesiology

## 2013-07-03 ENCOUNTER — Ambulatory Visit (HOSPITAL_COMMUNITY)
Admission: RE | Admit: 2013-07-03 | Discharge: 2013-07-03 | Disposition: A | Discharge status: Home / Self Care | Payer: 59 | Source: Ambulatory Visit | Attending: General Surgery | Admitting: General Surgery

## 2013-07-03 ENCOUNTER — Encounter (HOSPITAL_COMMUNITY): Payer: Self-pay | Admitting: *Deleted

## 2013-07-03 DIAGNOSIS — K8010 Calculus of gallbladder with chronic cholecystitis without obstruction: Secondary | ICD-10-CM | POA: Insufficient documentation

## 2013-07-03 HISTORY — PX: CHOLECYSTECTOMY: SHX55

## 2013-07-03 SURGERY — LAPAROSCOPIC CHOLECYSTECTOMY
Anesthesia: General | Site: Abdomen

## 2013-07-03 MED ORDER — KETOROLAC TROMETHAMINE 30 MG/ML IJ SOLN
30.0000 mg | Freq: Once | INTRAMUSCULAR | Status: AC
  Administered 2013-07-03: 30 mg via INTRAVENOUS
  Filled 2013-07-03: qty 1

## 2013-07-03 MED ORDER — CIPROFLOXACIN IN D5W 400 MG/200ML IV SOLN
400.0000 mg | INTRAVENOUS | Status: AC
  Administered 2013-07-03: 400 mg via INTRAVENOUS
  Filled 2013-07-03: qty 200

## 2013-07-03 MED ORDER — GLYCOPYRROLATE 0.2 MG/ML IJ SOLN
0.2000 mg | Freq: Once | INTRAMUSCULAR | Status: AC
  Administered 2013-07-03: 0.2 mg via INTRAVENOUS

## 2013-07-03 MED ORDER — SUCCINYLCHOLINE CHLORIDE 20 MG/ML IJ SOLN
INTRAMUSCULAR | Status: AC
  Filled 2013-07-03: qty 1

## 2013-07-03 MED ORDER — MIDAZOLAM HCL 2 MG/2ML IJ SOLN
INTRAMUSCULAR | Status: AC
  Filled 2013-07-03: qty 2

## 2013-07-03 MED ORDER — FENTANYL CITRATE 0.05 MG/ML IJ SOLN
INTRAMUSCULAR | Status: AC
  Filled 2013-07-03: qty 5

## 2013-07-03 MED ORDER — SODIUM CHLORIDE 0.9 % IR SOLN
Status: DC | PRN
  Administered 2013-07-03: 1000 mL

## 2013-07-03 MED ORDER — BUPIVACAINE HCL (PF) 0.5 % IJ SOLN
INTRAMUSCULAR | Status: DC | PRN
  Administered 2013-07-03: 10 mL

## 2013-07-03 MED ORDER — NEOSTIGMINE METHYLSULFATE 10 MG/10ML IV SOLN
INTRAVENOUS | Status: DC | PRN
  Administered 2013-07-03 (×2): 2 mg via INTRAVENOUS

## 2013-07-03 MED ORDER — GLYCOPYRROLATE 0.2 MG/ML IJ SOLN
INTRAMUSCULAR | Status: AC
  Filled 2013-07-03: qty 1

## 2013-07-03 MED ORDER — FENTANYL CITRATE 0.05 MG/ML IJ SOLN
INTRAMUSCULAR | Status: DC | PRN
  Administered 2013-07-03: 50 ug via INTRAVENOUS
  Administered 2013-07-03: 100 ug via INTRAVENOUS
  Administered 2013-07-03: 50 ug via INTRAVENOUS
  Administered 2013-07-03: 100 ug via INTRAVENOUS
  Administered 2013-07-03: 50 ug via INTRAVENOUS

## 2013-07-03 MED ORDER — FENTANYL CITRATE 0.05 MG/ML IJ SOLN
INTRAMUSCULAR | Status: AC
  Filled 2013-07-03: qty 2

## 2013-07-03 MED ORDER — LACTATED RINGERS IV SOLN
INTRAVENOUS | Status: DC
  Administered 2013-07-03 (×3): via INTRAVENOUS

## 2013-07-03 MED ORDER — ONDANSETRON HCL 4 MG/2ML IJ SOLN
4.0000 mg | Freq: Once | INTRAMUSCULAR | Status: AC
  Administered 2013-07-03: 4 mg via INTRAVENOUS

## 2013-07-03 MED ORDER — PROPOFOL 10 MG/ML IV BOLUS
INTRAVENOUS | Status: DC | PRN
  Administered 2013-07-03: 160 mg via INTRAVENOUS

## 2013-07-03 MED ORDER — FENTANYL CITRATE 0.05 MG/ML IJ SOLN
25.0000 ug | INTRAMUSCULAR | Status: DC | PRN
  Administered 2013-07-03 (×2): 50 ug via INTRAVENOUS
  Filled 2013-07-03: qty 2

## 2013-07-03 MED ORDER — POVIDONE-IODINE 10 % EX OINT
TOPICAL_OINTMENT | CUTANEOUS | Status: AC
  Filled 2013-07-03: qty 1

## 2013-07-03 MED ORDER — GLYCOPYRROLATE 0.2 MG/ML IJ SOLN
INTRAMUSCULAR | Status: AC
  Filled 2013-07-03: qty 2

## 2013-07-03 MED ORDER — ENOXAPARIN SODIUM 40 MG/0.4ML ~~LOC~~ SOLN
40.0000 mg | Freq: Once | SUBCUTANEOUS | Status: AC
  Administered 2013-07-03: 40 mg via SUBCUTANEOUS
  Filled 2013-07-03: qty 0.4

## 2013-07-03 MED ORDER — GLYCOPYRROLATE 0.2 MG/ML IJ SOLN
INTRAMUSCULAR | Status: DC | PRN
  Administered 2013-07-03: 0.4 mg via INTRAVENOUS

## 2013-07-03 MED ORDER — PROPOFOL 10 MG/ML IV BOLUS
INTRAVENOUS | Status: AC
  Filled 2013-07-03: qty 20

## 2013-07-03 MED ORDER — ONDANSETRON HCL 4 MG/2ML IJ SOLN
INTRAMUSCULAR | Status: AC
  Filled 2013-07-03: qty 2

## 2013-07-03 MED ORDER — ROCURONIUM BROMIDE 50 MG/5ML IV SOLN
INTRAVENOUS | Status: AC
  Filled 2013-07-03: qty 1

## 2013-07-03 MED ORDER — CHLORHEXIDINE GLUCONATE 4 % EX LIQD: 1.0000 "application " | Freq: Once | CUTANEOUS | Status: DC

## 2013-07-03 MED ORDER — LIDOCAINE HCL (PF) 1 % IJ SOLN
INTRAMUSCULAR | Status: AC
  Filled 2013-07-03: qty 5

## 2013-07-03 MED ORDER — LIDOCAINE HCL 1 % IJ SOLN
INTRAMUSCULAR | Status: DC | PRN
  Administered 2013-07-03: 50 mg via INTRADERMAL

## 2013-07-03 MED ORDER — ONDANSETRON HCL 4 MG/2ML IJ SOLN
4.0000 mg | Freq: Once | INTRAMUSCULAR | Status: AC | PRN
Start: 2013-07-03 — End: 2013-07-03
  Administered 2013-07-03: 4 mg via INTRAVENOUS

## 2013-07-03 MED ORDER — POVIDONE-IODINE 10 % OINT PACKET
TOPICAL_OINTMENT | CUTANEOUS | Status: DC | PRN
  Administered 2013-07-03: 1 via TOPICAL

## 2013-07-03 MED ORDER — MIDAZOLAM HCL 2 MG/2ML IJ SOLN
1.0000 mg | INTRAMUSCULAR | Status: DC | PRN
  Administered 2013-07-03 (×2): 2 mg via INTRAVENOUS

## 2013-07-03 MED ORDER — ROCURONIUM BROMIDE 100 MG/10ML IV SOLN
INTRAVENOUS | Status: DC | PRN
  Administered 2013-07-03: 30 mg via INTRAVENOUS

## 2013-07-03 MED ORDER — DEXTROSE 5 % IV SOLN
INTRAVENOUS | Status: DC | PRN
  Administered 2013-07-03: 12:00:00 via INTRAVENOUS

## 2013-07-03 MED ORDER — BUPIVACAINE HCL (PF) 0.5 % IJ SOLN
INTRAMUSCULAR | Status: AC
  Filled 2013-07-03: qty 30

## 2013-07-03 MED ORDER — SUCCINYLCHOLINE CHLORIDE 20 MG/ML IJ SOLN
INTRAMUSCULAR | Status: DC | PRN
  Administered 2013-07-03: 180 mg via INTRAVENOUS

## 2013-07-03 MED ORDER — OXYCODONE-ACETAMINOPHEN 7.5-325 MG PO TABS: 1.0000 | ORAL_TABLET | ORAL | Status: DC | PRN

## 2013-07-03 MED ORDER — HEMOSTATIC AGENTS (NO CHARGE) OPTIME
TOPICAL | Status: DC | PRN
  Administered 2013-07-03: 1 via TOPICAL

## 2013-07-03 MED ORDER — MIDAZOLAM HCL 5 MG/5ML IJ SOLN
INTRAMUSCULAR | Status: DC | PRN
  Administered 2013-07-03: 2 mg via INTRAVENOUS

## 2013-07-03 SURGICAL SUPPLY — 41 items
APPLIER CLIP LAPSCP 10X32 DD (CLIP) ×3 IMPLANT
BAG HAMPER (MISCELLANEOUS) ×3 IMPLANT
BLADE 11 SAFETY STRL DISP (BLADE) ×3 IMPLANT
CLOTH BEACON ORANGE TIMEOUT ST (SAFETY) ×3 IMPLANT
COVER LIGHT HANDLE STERIS (MISCELLANEOUS) ×6 IMPLANT
DECANTER SPIKE VIAL GLASS SM (MISCELLANEOUS) ×3 IMPLANT
DURAPREP 26ML APPLICATOR (WOUND CARE) ×3 IMPLANT
ELECT REM PT RETURN 9FT ADLT (ELECTROSURGICAL) ×3
ELECTRODE REM PT RTRN 9FT ADLT (ELECTROSURGICAL) ×1 IMPLANT
FILTER SMOKE EVAC LAPAROSHD (FILTER) ×3 IMPLANT
FORMALIN 10 PREFIL 120ML (MISCELLANEOUS) ×3 IMPLANT
GLOVE BIOGEL PI IND STRL 7.0 (GLOVE) ×2 IMPLANT
GLOVE BIOGEL PI INDICATOR 7.0 (GLOVE) ×4
GLOVE ECLIPSE 6.5 STRL STRAW (GLOVE) ×6 IMPLANT
GLOVE EXAM NITRILE PF LG BLUE (GLOVE) ×3 IMPLANT
GLOVE SURG SS PI 7.5 STRL IVOR (GLOVE) ×6 IMPLANT
GOWN STRL REUS W/TWL LRG LVL3 (GOWN DISPOSABLE) ×9 IMPLANT
HEMOSTAT SNOW SURGICEL 2X4 (HEMOSTASIS) ×3 IMPLANT
INST SET LAPROSCOPIC AP (KITS) ×3 IMPLANT
IV NS IRRIG 3000ML ARTHROMATIC (IV SOLUTION) IMPLANT
KIT ROOM TURNOVER APOR (KITS) ×3 IMPLANT
MANIFOLD NEPTUNE II (INSTRUMENTS) ×3 IMPLANT
NEEDLE INSUFFLATION 14GA 120MM (NEEDLE) ×3 IMPLANT
NS IRRIG 1000ML POUR BTL (IV SOLUTION) ×3 IMPLANT
PACK LAP CHOLE LZT030E (CUSTOM PROCEDURE TRAY) ×3 IMPLANT
PAD ARMBOARD 7.5X6 YLW CONV (MISCELLANEOUS) ×3 IMPLANT
POUCH SPECIMEN RETRIEVAL 10MM (ENDOMECHANICALS) ×3 IMPLANT
SET BASIN LINEN APH (SET/KITS/TRAYS/PACK) ×3 IMPLANT
SET TUBE IRRIG SUCTION NO TIP (IRRIGATION / IRRIGATOR) IMPLANT
SLEEVE ENDOPATH XCEL 5M (ENDOMECHANICALS) ×3 IMPLANT
SPONGE GAUZE 2X2 8PLY STER LF (GAUZE/BANDAGES/DRESSINGS) ×4
SPONGE GAUZE 2X2 8PLY STRL LF (GAUZE/BANDAGES/DRESSINGS) ×8 IMPLANT
STAPLER VISISTAT (STAPLE) ×3 IMPLANT
SUT VICRYL 0 UR6 27IN ABS (SUTURE) ×6 IMPLANT
TAPE CLOTH SURG 4X10 WHT LF (GAUZE/BANDAGES/DRESSINGS) ×3 IMPLANT
TROCAR ENDO BLADELESS 11MM (ENDOMECHANICALS) ×3 IMPLANT
TROCAR XCEL NON-BLD 5MMX100MML (ENDOMECHANICALS) ×3 IMPLANT
TROCAR XCEL UNIV SLVE 11M 100M (ENDOMECHANICALS) ×3 IMPLANT
TUBING INSUFFLATION (TUBING) ×3 IMPLANT
WARMER LAPAROSCOPE (MISCELLANEOUS) ×3 IMPLANT
YANKAUER SUCT 12FT TUBE ARGYLE (SUCTIONS) ×3 IMPLANT

## 2013-07-03 NOTE — Anesthesia Procedure Notes (Signed)
Procedure Name: Intubation Date/Time: 07/03/2013 12:04 PM Performed by: Despina [name], [name] Pre-anesthesia Checklist: Patient being monitored, Suction available, Emergency Drugs available and Patient identified Patient Re-evaluated:Patient Re-evaluated prior to inductionOxygen Delivery Method: Circle system utilized Preoxygenation: Pre-oxygenation with 100% oxygen Intubation Type: IV induction, Rapid sequence and Cricoid Pressure applied Ventilation: Mask ventilation without difficulty and Oral airway inserted - appropriate to patient size Laryngoscope Size: Mac and 3 Grade View: Grade I Tube type: Oral Tube size: 7.0 mm Number of attempts: 1 Airway Equipment and Method: Stylet Placement Confirmation: ETT inserted through vocal cords under direct vision,  positive ETCO2 and breath sounds checked- equal and bilateral Secured at: 22 cm Tube secured with: Tape Dental Injury: Teeth and Oropharynx as per pre-operative assessment

## 2013-07-03 NOTE — Anesthesia Postprocedure Evaluation (Signed)
  Anesthesia Post-op Note  Patient: Elizabeth Luna  Procedure(s) Performed: Procedure(s): LAPAROSCOPIC CHOLECYSTECTOMY (N/A)  Patient Location: PACU  Anesthesia Type:General  Level of Consciousness: awake, alert , oriented and patient cooperative  Airway and Oxygen Therapy: Patient Spontanous Breathing  Post-op Pain: 3 /10, mild  Post-op Assessment: Post-op Vital signs reviewed, Patient's Cardiovascular Status Stable, Respiratory Function Stable, Patent Airway, No signs of Nausea or vomiting and Pain level controlled  Post-op Vital Signs: Reviewed and stable  Last Vitals:  Filed Vitals:   07/03/13 1005  BP: 125/75  Pulse: 86  Temp: 37.1 C    Complications: No apparent anesthesia complications

## 2013-07-03 NOTE — Anesthesia Preprocedure Evaluation (Addendum)
Anesthesia Evaluation  Patient identified by MRN, date of birth, ID band Patient awake    Reviewed: Allergy & Precautions, H&P , NPO status , Patient's Chart, lab work & pertinent test results  Airway Mallampati: II TM Distance: >3 FB Neck ROM: Full    Dental  (+) Chipped, Teeth Intact,    Pulmonary neg pulmonary ROS, Current Smoker,  breath sounds clear to auscultation        Cardiovascular negative cardio ROS  Rhythm:Regular Rate:Normal     Neuro/Psych    GI/Hepatic GERD-  Medicated,  Endo/Other  Morbid obesity  Renal/GU      Musculoskeletal   Abdominal   Peds  Hematology   Anesthesia Other Findings   Reproductive/Obstetrics                          Anesthesia Physical Anesthesia Plan  ASA: II  Anesthesia Plan: General   Post-op Pain Management:    Induction: Intravenous, Rapid sequence and Cricoid pressure planned  Airway Management Planned: Oral ETT  Additional Equipment:   Intra-op Plan:   Post-operative Plan: Extubation in OR  Informed Consent: I have reviewed the patients History and Physical, chart, labs and discussed the procedure including the risks, benefits and alternatives for the proposed anesthesia with the patient or authorized representative who has indicated his/her understanding and acceptance.     Plan Discussed with:   Anesthesia Plan Comments:         Anesthesia Quick Evaluation

## 2013-07-03 NOTE — Discharge Instructions (Signed)
Laparoscopic Cholecystectomy, Care After °Refer to this sheet in the next few weeks. These instructions provide you with information on caring for yourself after your procedure. Your health care provider may also give you more specific instructions. Your treatment has been planned according to current medical practices, but problems sometimes occur. Call your health care provider if you have any problems or questions after your procedure. °WHAT TO EXPECT AFTER THE PROCEDURE °After your procedure, it is typical to have the following: °· Pain at your incision sites. You will be given pain medicines to control the pain. °· Mild nausea or vomiting. This should improve after the first 24 hours. °· Bloating and possibly shoulder pain from the gas used during the procedure. This will improve after the first 24 hours. °HOME CARE INSTRUCTIONS  °· Change bandages (dressings) as directed by your health care provider. °· Keep the wound dry and clean. You may wash the wound gently with soap and water. Gently blot or dab the area dry. °· Do not take baths or use swimming pools or hot tubs for 2 weeks or until your health care provider approves. °· Only take over-the-counter or prescription medicines as directed by your health care provider. °· Continue your normal diet as directed by your health care provider. °· Do not lift anything heavier than 10 pounds (4.5 kg) until your health care provider approves. °· Do not play contact sports for 1 week or until your health care provider approves. °SEEK MEDICAL CARE IF:  °· You have redness, swelling, or increasing pain in the wound. °· You notice yellowish-white fluid (pus) coming from the wound. °· You have drainage from the wound that lasts longer than 1 day. °· You notice a bad smell coming from the wound or dressing. °· Your surgical cuts (incisions) break open. °SEEK IMMEDIATE MEDICAL CARE IF:  °· You develop a rash. °· You have difficulty breathing. °· You have chest pain. °· You  have a fever. °· You have increasing pain in the shoulders (shoulder strap areas). °· You have dizzy episodes or faint while standing. °· You have severe abdominal pain. °· You feel sick to your stomach (nauseous) or throw up (vomit) and this lasts for more than 1 day. °Document Released: 02/12/2005 Document Revised: 12/03/2012 Document Reviewed: 09/24/2012 °[employer]® Patient Information ©2014 [employer]. ° °

## 2013-07-03 NOTE — Interval H&P Note (Signed)
History and Physical Interval Note:  07/03/2013 11:32 AM  Elizabeth Luna  has presented today for surgery, with the diagnosis of cholelithiasis  The various methods of treatment have been discussed with the patient and family. After consideration of risks, benefits and other options for treatment, the patient has consented to  Procedure(s): LAPAROSCOPIC CHOLECYSTECTOMY (N/A) as a surgical intervention .  The patient's history has been reviewed, patient examined, no change in status, stable for surgery.  I have reviewed the patient's chart and labs.  Questions were answered to the patient's satisfaction.     Dalia HeadingMark A Willeen Novak

## 2013-07-03 NOTE — Op Note (Signed)
Patient:  Elizabeth Luna  DOB:  Feb 19, 1986  MRN:  [id number]   Preop Diagnosis:  Cholecystitis, cholelithiasis  Postop Diagnosis:  Same  Procedure:  Laparoscopic cholecystectomy  Surgeon:  Franky MachoMark Stephanos Fan, M.D.  Anes:  General endotracheal  Indications:  Patient is a 28 year old black female presents with biliary colic secondary to cholelithiasis. The risks and benefits of the procedure including bleeding, infection, hepatobiliary, and the possibility of an open procedure were fully explained to the patient, who gave informed consent.  Procedure note:  The patient is placed the supine position. After induction of general endotracheal anesthesia, the abdomen was prepped and draped using usual sterile technique with DuraPrep. Surgical site confirmation was performed.  A supraumbilical incision was made down the fascia. A Veress needle was introduced into the abdominal cavity and confirmation of placement was done using the saline drop test. The abdomen was then insufflated to 16 mm mercury pressure. An 11 mm trocar was introduced into the abdominal cavity after visualization without difficulty. The patient was placed in reverse Trendelenburg position and additional 11 mm trocar was placed the epigastric region 5 mm trochars were placed the right the quadrant and right flank regions. The liver was inspected and noted to be within normal limits. Gallbladder was distended with a stone lodged in the infundibulum of the gallbladder. Moderate inflammatory changes of the wall were noted. The gallbladder was retracted in a dynamic fashion in order to expose the triangle of Calot. The cystic duct was first identified. Its juncture to the infundibulum was fully identified. Endoclips were placed proximally and distally on the cystic duct, and the cystic duct was divided. This was likewise done cystic artery. The gallbladder was then freed away from the gallbladder fossa using Bovie electrocautery. The gallbladder  was delivered through the epigastric trocar site using an Endo Catch bag. The gallbladder fossa was inspected and no abnormal bleeding or bile leakage was noted. Surgicel is placed the gallbladder fossa. All fluid and air were then evacuated from the abdominal cavity prior to removal of the trochars.  All wounds were irrigated with normal saline. All wounds were injected with 0.5% Sensorcaine. The supraumbilical fascia as well as epigastric fascia were reapproximated using 0 Vicryl interrupted sutures. All skin incisions were closed using staples. Betadine ointment and dry sterile dressings were applied.  All tape and needle counts were correct at the end of the procedure. Patient was extubated in the operating room and transferred to PACU in stable condition.  Complications:  None  EBL:  Minimal  Specimen:  Gallbladder

## 2013-07-03 NOTE — Transfer of Care (Signed)
Immediate Anesthesia Transfer of Care Note  Patient: Elizabeth Luna  Procedure(s) Performed: Procedure(s): LAPAROSCOPIC CHOLECYSTECTOMY (N/A)  Patient Location: PACU  Anesthesia Type:General  Level of Consciousness: awake and patient cooperative  Airway & Oxygen Therapy: Patient Spontanous Breathing and Patient connected to face mask oxygen  Post-op Assessment: Report given to PACU RN, Post -op Vital signs reviewed and stable and Patient moving all extremities  Post vital signs: Reviewed and stable  Complications: No apparent anesthesia complications

## 2013-07-07 ENCOUNTER — Encounter (HOSPITAL_COMMUNITY): Payer: Self-pay | Admitting: General Surgery

## 2013-12-18 ENCOUNTER — Encounter (HOSPITAL_COMMUNITY): Payer: Self-pay | Admitting: Emergency Medicine

## 2013-12-18 ENCOUNTER — Emergency Department (HOSPITAL_COMMUNITY)
Admission: EM | Admit: 2013-12-18 | Discharge: 2013-12-18 | Disposition: A | Discharge status: Home / Self Care | Payer: 59 | Attending: Emergency Medicine | Admitting: Emergency Medicine

## 2013-12-18 DIAGNOSIS — M549 Dorsalgia, unspecified: Secondary | ICD-10-CM | POA: Insufficient documentation

## 2013-12-18 DIAGNOSIS — Z720 Tobacco use: Secondary | ICD-10-CM | POA: Insufficient documentation

## 2013-12-18 DIAGNOSIS — Z8669 Personal history of other diseases of the nervous system and sense organs: Secondary | ICD-10-CM | POA: Insufficient documentation

## 2013-12-18 DIAGNOSIS — Z8719 Personal history of other diseases of the digestive system: Secondary | ICD-10-CM | POA: Insufficient documentation

## 2013-12-18 DIAGNOSIS — J40 Bronchitis, not specified as acute or chronic: Secondary | ICD-10-CM

## 2013-12-18 DIAGNOSIS — R51 Headache: Secondary | ICD-10-CM | POA: Insufficient documentation

## 2013-12-18 DIAGNOSIS — J209 Acute bronchitis, unspecified: Secondary | ICD-10-CM | POA: Insufficient documentation

## 2013-12-18 MED ORDER — AMOXICILLIN 500 MG PO CAPS: 500.0000 mg | ORAL_CAPSULE | Freq: Three times a day (TID) | ORAL | Status: DC

## 2013-12-18 MED ORDER — HYDROCOD POLST-CHLORPHEN POLST 10-8 MG/5ML PO LQCR: 5.0000 mL | Freq: Two times a day (BID) | ORAL | Status: DC | PRN

## 2013-12-18 MED ORDER — IBUPROFEN 800 MG PO TABS: 800.0000 mg | ORAL_TABLET | Freq: Three times a day (TID) | ORAL | Status: DC | PRN

## 2013-12-18 NOTE — Discharge Instructions (Signed)
Drink plenty of fluids and follow up as needed °

## 2013-12-18 NOTE — ED Notes (Signed)
Pt co cough, congestion, facial pain and pain in lower back x2 days, cough productive - green sputum.

## 2013-12-18 NOTE — ED Provider Notes (Signed)
CSN: [id number]     Arrival date & time 12/18/13  [id number] History   First MD Initiated Contact with Patient 12/18/13 1024    This chart was scribed for Benny LennertJoseph L Misbah Hornaday, MD by Marica OtterNusrat Rahman, ED Scribe. This patient was seen in room [facility] and the patient's care was started at 10:30 AM.  Chief Complaint  Patient presents with  . Nasal Congestion   Patient is a 28 y.o. female presenting with cough. The history is provided by the patient. No language interpreter was used.  Cough Cough characteristics:  Productive Sputum characteristics:  Green Severity:  Moderate Onset quality:  Sudden Duration:  1 day Timing:  Intermittent Progression:  Unchanged Chronicity:  New Smoker: yes   Worsened by:  Nothing tried Ineffective treatments:  None tried Associated symptoms: chills, fever and sore throat   Associated symptoms: no chest pain, no eye discharge, no headaches and no rash   Fever:    Duration:  1 day   Timing:  Intermittent   Max temp PTA (F):  101F   Progression:  Resolved Sore throat:    Severity:  Moderate   Onset quality:  Sudden   Duration:  1 day   Timing:  Constant   Progression:  Unchanged  PCP: Rudi HeapMOORE, DONALD, MD HPI Comments: Elizabeth Luna is a 28 y.o. female, with medical Hx noted below, who presents to the Emergency Department complaining of intermittent, acute, productive cough with green sputum with associated sore throat, nasal congestion, facial pain and lower back pain onset last night. Pt also complains of associated fever and chills and reports she had a fever of 101F this morning. At triage pt's temperature was 97.5.   Pt denies any daily meds, allergies or having the flu shot.   Past Medical History  Diagnosis Date  . Bell's palsy   . GERD (gastroesophageal reflux disease)    Past Surgical History  Procedure Laterality Date  . Foot surgery Right     removal of glass  . Cholecystectomy N/A 07/03/2013    Procedure: LAPAROSCOPIC CHOLECYSTECTOMY;   Surgeon: Dalia HeadingMark A Jenkins, MD;  Location: [facility];  Service: General;  Laterality: N/A;   Family History  Problem Relation Age of Onset  . Thyroid disease Mother   . Cancer Mother    History  Substance Use Topics  . Smoking status: Current Every Day Smoker -- 0.50 packs/day for 8 years    Types: Cigarettes  . Smokeless tobacco: Never Used  . Alcohol Use: 2.4 oz/week    4 Cans of beer per week     Comment: occassional   OB History   Grav Para Term Preterm Abortions TAB SAB Ect Mult Living            0     Review of Systems  Constitutional: Positive for fever and chills. Negative for appetite change and fatigue.  HENT: Positive for congestion and sore throat. Negative for ear discharge and sinus pressure.        Positive for facial pain  Eyes: Negative for discharge.  Respiratory: Positive for cough.   Cardiovascular: Negative for chest pain.  Gastrointestinal: Negative for abdominal pain and diarrhea.  Genitourinary: Negative for frequency and hematuria.  Musculoskeletal: Positive for back pain (lower back pain).  Skin: Negative for rash.  Neurological: Negative for seizures and headaches.  Psychiatric/Behavioral: Negative for hallucinations.      Allergies  Tramadol  Home Medications   Prior to Admission medications   Not on File  Triage Vitals: BP 119/59  Pulse 75  Temp(Src) 97.5 F (36.4 C) (Oral)  Resp 16  Ht 5\' 7"  (1.702 m)  Wt 274 lb (124.286 kg)  BMI 42.90 kg/m2  SpO2 100%  LMP 12/04/2013 Physical Exam  Constitutional: She is oriented to person, place, and time. She appears well-developed.  HENT:  Head: Normocephalic.  Eyes: Conjunctivae and EOM are normal. No scleral icterus.  Neck: Neck supple. No thyromegaly present.  Cardiovascular: Normal rate and regular rhythm.  Exam reveals no gallop and no friction rub.   No murmur heard. Pulmonary/Chest: No stridor. She has no wheezes. She has no rales. She exhibits no tenderness.  Abdominal: She exhibits  no distension. There is no tenderness. There is no rebound.  Musculoskeletal: Normal range of motion. She exhibits no edema.  Lymphadenopathy:    She has no cervical adenopathy.  Neurological: She is oriented to person, place, and time. She exhibits normal muscle tone. Coordination normal.  Skin: No rash noted. No erythema.  Psychiatric: She has a normal mood and affect. Her behavior is normal.    ED Course  Procedures (including critical care time) DIAGNOSTIC STUDIES: Oxygen Saturation is 100% on RA, nl by my interpretation.    COORDINATION OF CARE: 10:31 AM-Discussed treatment plan which includes meds with pt at bedside and pt agreed to plan.   Labs Review Labs Reviewed - No data to display  Imaging Review No results found.   EKG Interpretation None      MDM   Final diagnoses:  None    The chart was scribed for me under my direct supervision.  I personally performed the history, physical, and medical decision making and all procedures in the evaluation of this patient.Benny Lennert.   Yostin Malacara L Michaela Shankel, MD 12/23/13 [phone]

## 2016-02-22 IMAGING — US US ABDOMEN LIMITED
1 series · 14 of 25 positions shown · non-contrast
Comparison: None.

CLINICAL DATA: Right upper quadrant pain after eating fried foods

EXAM:
US ABDOMEN LIMITED - RIGHT UPPER QUADRANT

[Series 1: us abdomen limited · 0.28mm/px · 14 of 64 slices shown]
[im 1/64]
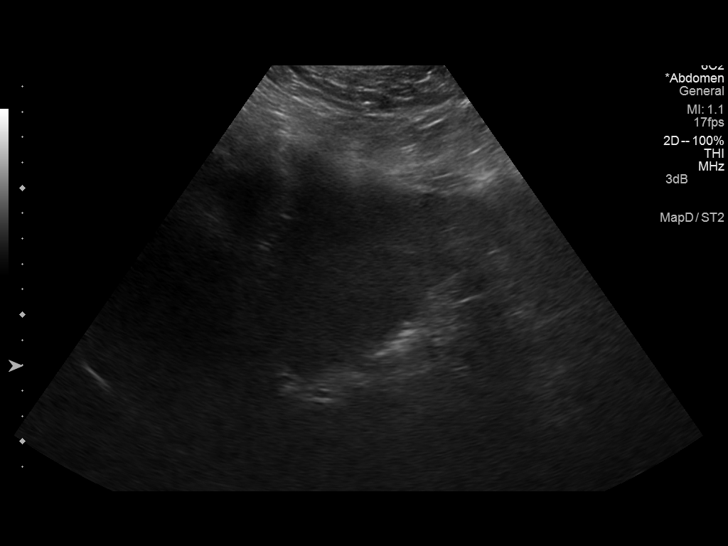
[im 6/64]
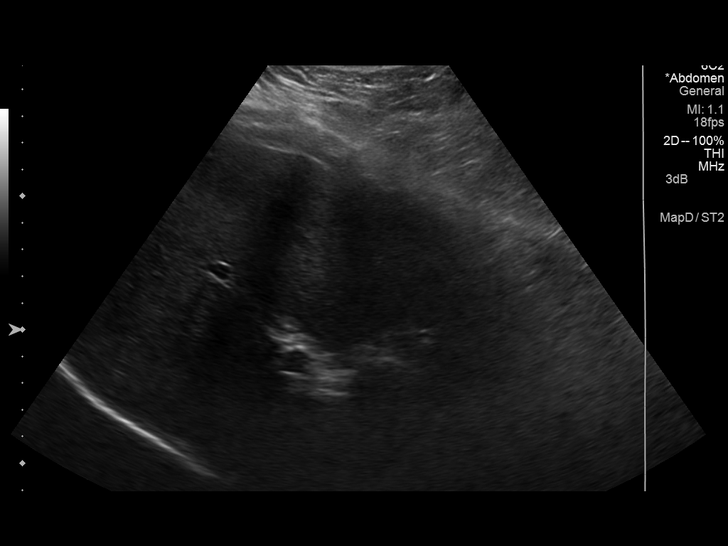
[im 11/64]
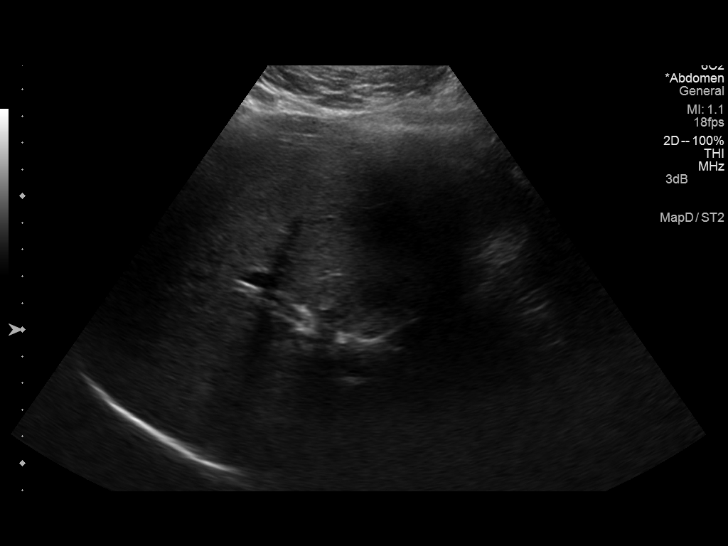
[im 16/64]
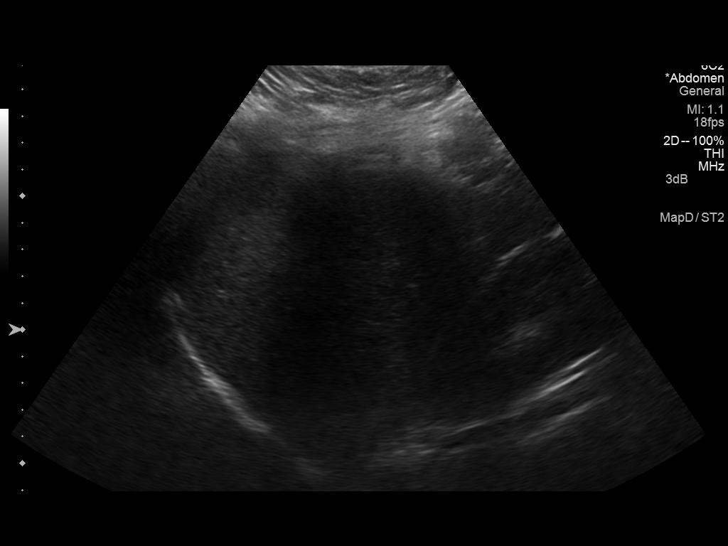
[im 22/64]
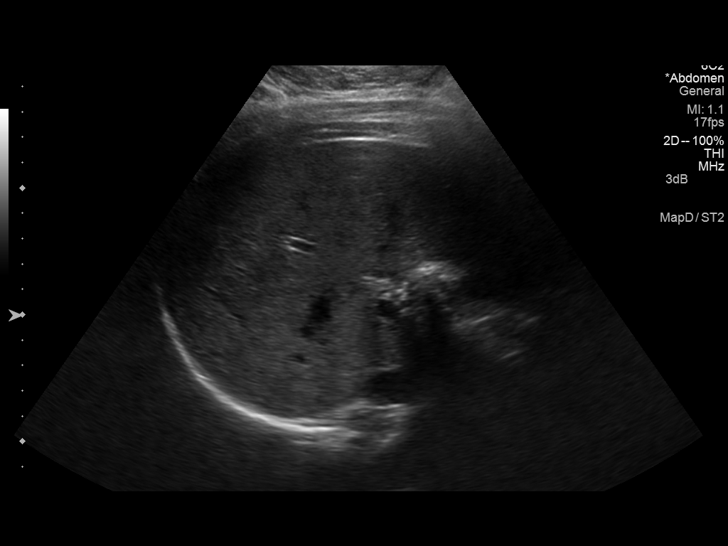
[im 24/64]
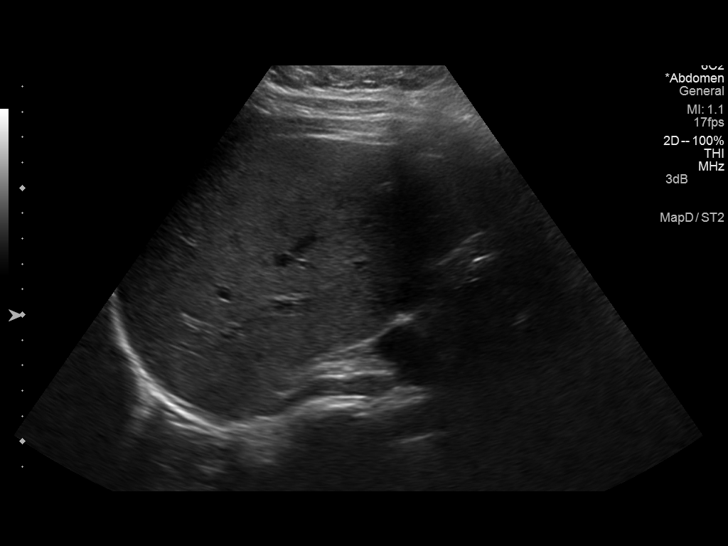
[im 29/64]
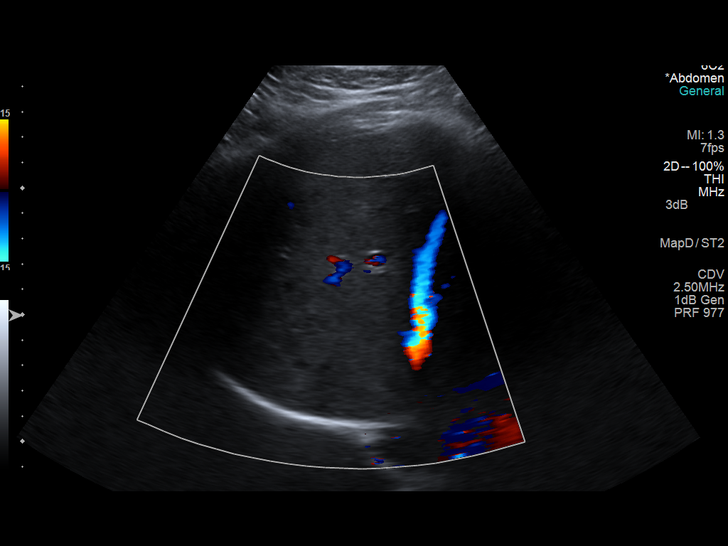
[im 35/64]
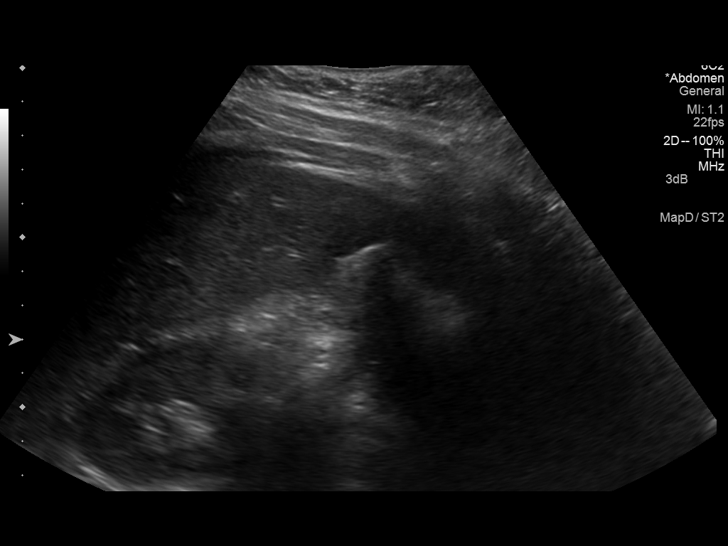
[im 40/64]
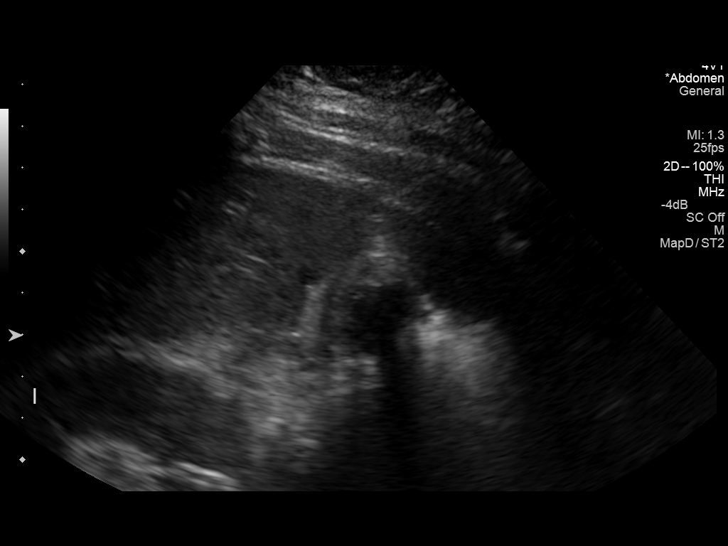
[im 43/64]
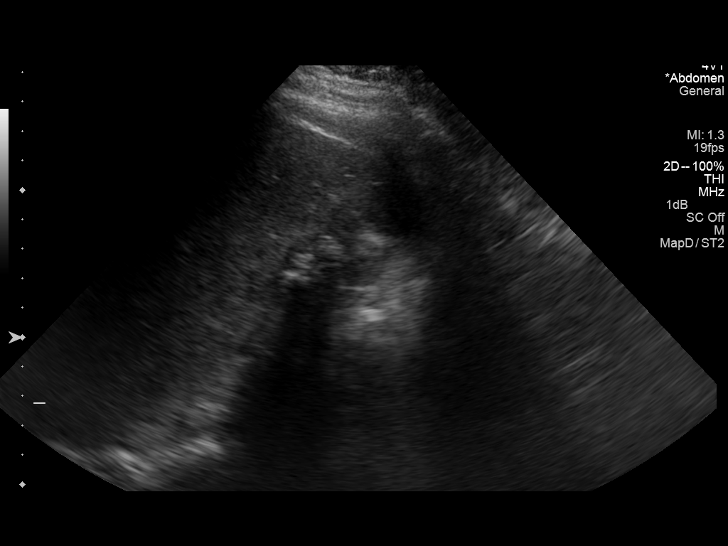
[im 48/64]
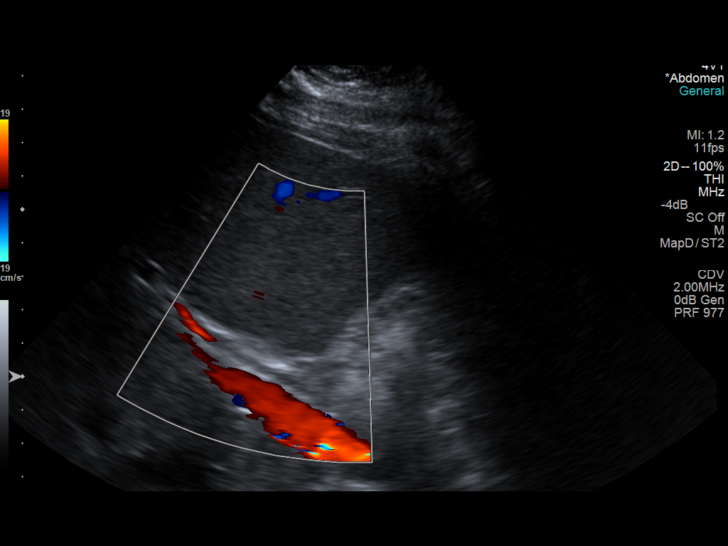
[im 53/64]
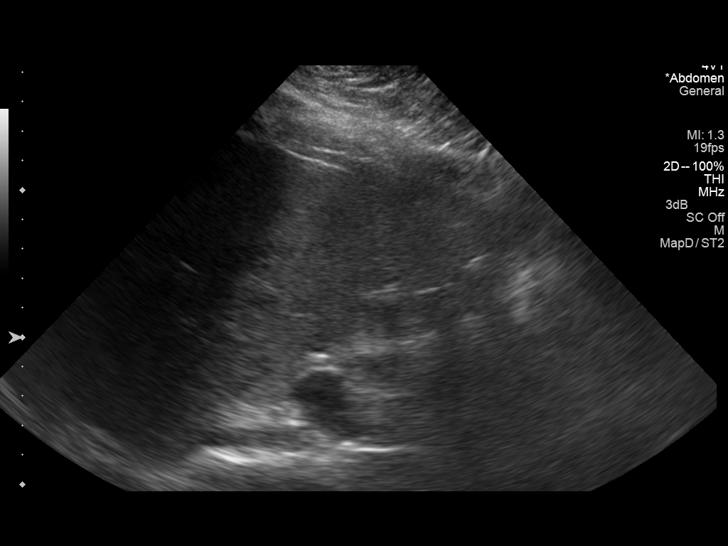
[im 58/64]
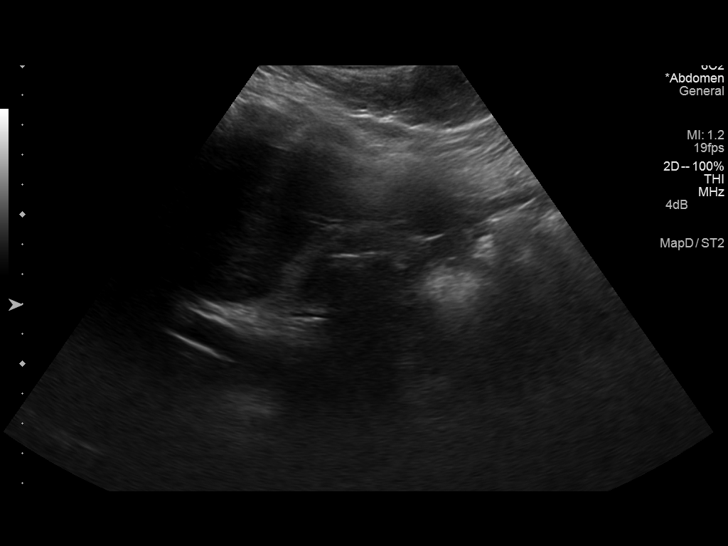
[im 64/64]
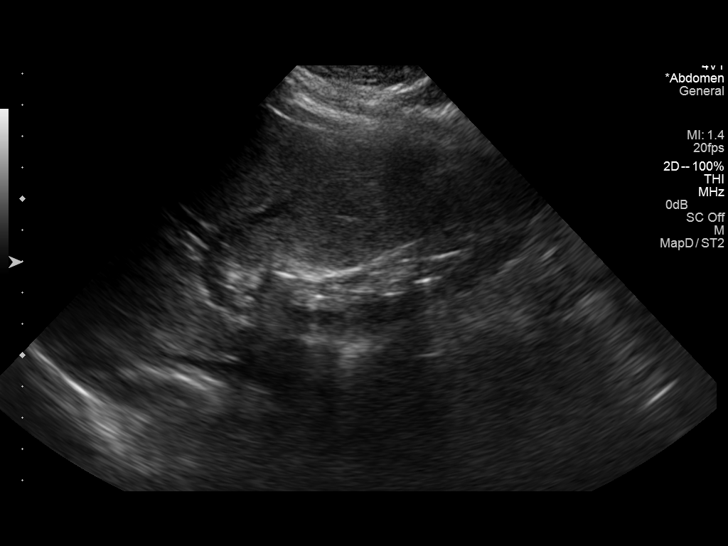

[14 of 25 positions shown; findings below may reference images not displayed]

FINDINGS: Gallbladder:

The gallbladder lumen is filled with echogenic shadowing foci
consistent with cholelithiasis. Per the sonographer, sonographic
Murphy sign was negative. No pericholecystic fluid. Gallbladder wall
thickness is difficult to measure. There is likely wall thickening
to 6 mm.

Common bile duct:

Diameter: Within normal limits at 3.8 mm.

Liver:

No focal lesion identified. Within normal limits in parenchymal
echogenicity. Main portal vein is patent with normal hepatopetal
flow.
IMPRESSION: Numerous gallstones fill the gallbladder lumen and there is evidence
of gallbladder wall thickening. Sonographic findings are most
suggestive of cholelithiasis and chronic cholecystitis.

## 2016-04-25 ENCOUNTER — Encounter (HOSPITAL_COMMUNITY): Payer: Self-pay | Admitting: Emergency Medicine

## 2016-04-25 ENCOUNTER — Emergency Department (HOSPITAL_COMMUNITY)
Admission: EM | Admit: 2016-04-25 | Discharge: 2016-04-25 | Disposition: A | Discharge status: Home / Self Care | Payer: Self-pay | Attending: Emergency Medicine | Admitting: Emergency Medicine

## 2016-04-25 DIAGNOSIS — F17210 Nicotine dependence, cigarettes, uncomplicated: Secondary | ICD-10-CM | POA: Insufficient documentation

## 2016-04-25 DIAGNOSIS — Y929 Unspecified place or not applicable: Secondary | ICD-10-CM | POA: Insufficient documentation

## 2016-04-25 DIAGNOSIS — M546 Pain in thoracic spine: Secondary | ICD-10-CM | POA: Insufficient documentation

## 2016-04-25 DIAGNOSIS — Y999 Unspecified external cause status: Secondary | ICD-10-CM | POA: Insufficient documentation

## 2016-04-25 DIAGNOSIS — Y9389 Activity, other specified: Secondary | ICD-10-CM | POA: Insufficient documentation

## 2016-04-25 DIAGNOSIS — W108XXA Fall (on) (from) other stairs and steps, initial encounter: Secondary | ICD-10-CM | POA: Insufficient documentation

## 2016-04-25 DIAGNOSIS — S39012A Strain of muscle, fascia and tendon of lower back, initial encounter: Secondary | ICD-10-CM | POA: Insufficient documentation

## 2016-04-25 MED ORDER — IBUPROFEN 800 MG PO TABS
800.0000 mg | ORAL_TABLET | Freq: Once | ORAL | Status: AC
  Administered 2016-04-25: 800 mg via ORAL
  Filled 2016-04-25: qty 1

## 2016-04-25 MED ORDER — CYCLOBENZAPRINE HCL 10 MG PO TABS: 10.0000 mg | ORAL_TABLET | Freq: Two times a day (BID) | ORAL | 0 refills | Status: DC | PRN

## 2016-04-25 MED ORDER — IBUPROFEN 800 MG PO TABS: 800.0000 mg | ORAL_TABLET | Freq: Three times a day (TID) | ORAL | 0 refills | Status: DC

## 2016-04-25 NOTE — ED Provider Notes (Signed)
[facility] Provider Note   CSN: [id number] Arrival date & time: 04/25/16  1642     History   Chief Complaint Chief Complaint  Patient presents with  . Back Pain    HPI CIELLE AGUILA is a 31 y.o. female.  Patient presents to the emergency department with chief complaint of mechanical fall. She states that she tripped over her dog yesterday and fell down the stairs. She landed on her buttocks, and slid down many stairs. She denies any head injury. Denies a loss of consciousness. She states that she felt fine initially afterward, but now has some pain in her low back. She denies any other injuries. She has not taken anything for her symptoms. Her symptoms are worsened with palpation and movement.   The history is provided by the patient. No language interpreter was used.    Past Medical History:  Diagnosis Date  . Bell's palsy   . GERD (gastroesophageal reflux disease)     There are no active problems to display for this patient.   Past Surgical History:  Procedure Laterality Date  . CHOLECYSTECTOMY N/A 07/03/2013   Procedure: LAPAROSCOPIC CHOLECYSTECTOMY;  Surgeon: Dalia Heading, MD;  Location: [facility];  Service: General;  Laterality: N/A;  . FOOT SURGERY Right    removal of glass    OB History    Gravida Para Term Preterm AB Living             0   SAB TAB Ectopic Multiple Live Births                   Home Medications    Prior to Admission medications   Medication Sig Start Date End Date Taking? Authorizing Provider  amoxicillin (AMOXIL) 500 MG capsule Take 1 capsule (500 mg total) by mouth 3 (three) times daily. 12/18/13   Bethann Berkshire, MD  chlorpheniramine-HYDROcodone [facility] PENNKINETIC ER) 10-8 MG/5ML LQCR Take 5 mLs by mouth every 12 (twelve) hours as needed for cough. 12/18/13   Bethann Berkshire, MD  cyclobenzaprine (FLEXERIL) 10 MG tablet Take 1 tablet (10 mg total) by mouth 2 (two) times daily as needed for muscle spasms. 04/25/16   Roxy Horseman, PA-C  ibuprofen (ADVIL,MOTRIN) 800 MG tablet Take 1 tablet (800 mg total) by mouth 3 (three) times daily. 04/25/16   Roxy Horseman, PA-C    Family History Family History  Problem Relation Age of Onset  . Thyroid disease Mother   . Cancer Mother     Social History Social History  Substance Use Topics  . Smoking status: Current Every Day Smoker    Packs/day: 0.50    Years: 8.00    Types: Cigarettes  . Smokeless tobacco: Never Used  . Alcohol use 2.4 oz/week    4 Cans of beer per week     Comment: occassional     Allergies   Tramadol   Review of Systems Review of Systems  Musculoskeletal: Positive for arthralgias, back pain and myalgias.  All other systems reviewed and are negative.    Physical Exam Updated Vital Signs BP 118/68 (BP Location: Left Arm)   Pulse 90   Temp 98.3 F (36.8 C) (Oral)   Resp 18   Ht 5\' 6"  (1.676 m)   Wt 127 kg   LMP 04/11/2016 (Exact Date)   SpO2 100%   BMI 45.19 kg/m   Physical Exam  Constitutional: She is oriented to person, place, and time. She appears well-developed and well-nourished.  HENT:  Head: Normocephalic and atraumatic.  Eyes: Conjunctivae and EOM are normal. Pupils are equal, round, and reactive to light.  Neck: Normal range of motion. Neck supple.  Cardiovascular: Normal rate and regular rhythm.  Exam reveals no gallop and no friction rub.   No murmur heard. Pulmonary/Chest: Effort normal and breath sounds normal. No respiratory distress. She has no wheezes. She has no rales. She exhibits no tenderness.  Abdominal: Soft. Bowel sounds are normal. She exhibits no distension and no mass. There is no tenderness. There is no rebound and no guarding.  Musculoskeletal: Normal range of motion. She exhibits no edema or tenderness.  Lower thoracic and upper lumbar paraspinal muscles are tender to palpation, no bony abnormality or deformity, no step-offs or crepitus  Moves all extremities without difficulty    Neurological: She is alert and oriented to person, place, and time.  Sensation and strength intact throughout  Skin: Skin is warm and dry.  Psychiatric: She has a normal mood and affect. Her behavior is normal. Judgment and thought content normal.  Nursing note and vitals reviewed.    ED Treatments / Results  Labs (all labs ordered are listed, but only abnormal results are displayed) Labs Reviewed - No data to display  EKG  EKG Interpretation None       Radiology No results found.  Procedures Procedures (including critical care time)  Medications Ordered in ED Medications  ibuprofen (ADVIL,MOTRIN) tablet 800 mg (not administered)     Initial Impression / Assessment and Plan / ED Course  I have reviewed the triage vital signs and the nursing notes.  Pertinent labs & imaging results that were available during my care of the patient were reviewed by me and considered in my medical decision making (see chart for details).     Patient with mechanical fall yesterday. She slid down her stairs on her backside. She complains of low and mid back pain. I offered the patient imaging, which she declined, stating that she cannot afford it. She is ambulatory, and does not have any bony tenderness, but is more tender in the paraspinal musculature, I will recommend conservative therapy with NSAIDs and Flexeril. Recommend ice, heat, and stretching. Patient understands that without getting imaging, I cannot tell for certain if there is a fracture or not. She states that she will return if her symptoms worsen to obtain imaging. She does not have any other apparent injuries. Recommend primary care follow-up.  Final Clinical Impressions(s) / ED Diagnoses   Final diagnoses:  Strain of lumbar region, initial encounter  Acute bilateral thoracic back pain    New Prescriptions New Prescriptions   CYCLOBENZAPRINE (FLEXERIL) 10 MG TABLET    Take 1 tablet (10 mg total) by mouth 2 (two) times  daily as needed for muscle spasms.   IBUPROFEN (ADVIL,MOTRIN) 800 MG TABLET    Take 1 tablet (800 mg total) by mouth 3 (three) times daily.     Roxy HorsemanRobert Renard Caperton, PA-C 04/25/16 1910    Maia PlanJoshua G Long, MD 04/26/16 [phone]

## 2016-04-25 NOTE — ED Triage Notes (Signed)
Fell down steps yesterday trying to miss her dog, hit back on wood stairs.  Fell down 16 steps and landed on rocks.   Rates pain 7/10.  Fell backwards hitting buttocks and back.  Denies hitting head.

## 2016-08-13 ENCOUNTER — Encounter (HOSPITAL_COMMUNITY): Payer: Self-pay | Admitting: Emergency Medicine

## 2016-08-13 ENCOUNTER — Emergency Department (HOSPITAL_COMMUNITY)
Admission: EM | Admit: 2016-08-13 | Discharge: 2016-08-13 | Disposition: A | Discharge status: Home / Self Care | Payer: Self-pay | Attending: Emergency Medicine | Admitting: Emergency Medicine

## 2016-08-13 DIAGNOSIS — F17210 Nicotine dependence, cigarettes, uncomplicated: Secondary | ICD-10-CM | POA: Insufficient documentation

## 2016-08-13 DIAGNOSIS — J069 Acute upper respiratory infection, unspecified: Secondary | ICD-10-CM | POA: Insufficient documentation

## 2016-08-13 DIAGNOSIS — J329 Chronic sinusitis, unspecified: Secondary | ICD-10-CM | POA: Insufficient documentation

## 2016-08-13 LAB — RAPID STREP SCREEN (MED CTR MEBANE ONLY): Streptococcus, Group A Screen (Direct): NEGATIVE

## 2016-08-13 MED ORDER — DEXAMETHASONE 4 MG PO TABS: 4.0000 mg | ORAL_TABLET | Freq: Two times a day (BID) | ORAL | 0 refills | Status: DC

## 2016-08-13 MED ORDER — LORATADINE-PSEUDOEPHEDRINE ER 5-120 MG PO TB12: 1.0000 | ORAL_TABLET | Freq: Two times a day (BID) | ORAL | 0 refills | Status: DC

## 2016-08-13 MED ORDER — IBUPROFEN 800 MG PO TABS
800.0000 mg | ORAL_TABLET | Freq: Once | ORAL | Status: AC
  Administered 2016-08-13: 800 mg via ORAL
  Filled 2016-08-13: qty 1

## 2016-08-13 MED ORDER — PSEUDOEPHEDRINE HCL 60 MG PO TABS
60.0000 mg | ORAL_TABLET | Freq: Once | ORAL | Status: AC
  Administered 2016-08-13: 60 mg via ORAL
  Filled 2016-08-13: qty 1

## 2016-08-13 NOTE — Discharge Instructions (Signed)
Your strep test is negative. Please increase fluids. Please wash hands frequently. Use Claritin-D every 12 hours, and use 600 mg of ibuprofen with breakfast, lunch, dinner, and at bedtime for fever, chills, pain, and inflammation. Use Decadron 2 times daily with a meal until all taken. Please use a mask until symptoms have resolved.

## 2016-08-13 NOTE — ED Notes (Signed)
ED Provider at bedside. 

## 2016-08-13 NOTE — ED Provider Notes (Signed)
[facility] Provider Note   CSN: [id number] Arrival date & time: 08/13/16  1156     History   Chief Complaint Chief Complaint  Patient presents with  . Sore Throat    HPI Elizabeth Luna is a 31 y.o. female.  Patient is a 31 year old female who presents to the emergency department with sore throat and ear pain.  The patient states that she started having problems 2 or 3 days ago. She's had some low-grade temperature elevations. She's had a lot of nasal congestion and also some sore throat. She has been able to get liquids down. In the last 24 hours she has been having increasing pain in her years. She feels as though she is underwater or in a bubble. She has some body aches present no unusual rash appreciated. Nothing makes this problem any better, and nothing seems to make it any worse at this point.      Past Medical History:  Diagnosis Date  . Bell's palsy   . GERD (gastroesophageal reflux disease)     There are no active problems to display for this patient.   Past Surgical History:  Procedure Laterality Date  . CHOLECYSTECTOMY N/A 07/03/2013   Procedure: LAPAROSCOPIC CHOLECYSTECTOMY;  Surgeon: Dalia Heading, MD;  Location: [facility];  Service: General;  Laterality: N/A;  . FOOT SURGERY Right    removal of glass    OB History    Gravida Para Term Preterm AB Living             0   SAB TAB Ectopic Multiple Live Births                   Home Medications    Prior to Admission medications   Medication Sig Start Date End Date Taking? Authorizing Provider  amoxicillin (AMOXIL) 500 MG capsule Take 1 capsule (500 mg total) by mouth 3 (three) times daily. 12/18/13   Bethann Berkshire, MD  chlorpheniramine-HYDROcodone (TUSSIONEX PENNKINETIC ER) 10-8 MG/5ML LQCR Take 5 mLs by mouth every 12 (twelve) hours as needed for cough. 12/18/13   Bethann Berkshire, MD  cyclobenzaprine (FLEXERIL) 10 MG tablet Take 1 tablet (10 mg total) by mouth 2 (two) times daily as needed for  muscle spasms. 04/25/16   Roxy Horseman, PA-C  dexamethasone (DECADRON) 4 MG tablet Take 1 tablet (4 mg total) by mouth 2 (two) times daily with a meal. 08/13/16   Ivery Quale, PA-C  ibuprofen (ADVIL,MOTRIN) 800 MG tablet Take 1 tablet (800 mg total) by mouth 3 (three) times daily. 04/25/16   Roxy Horseman, PA-C  loratadine-pseudoephedrine (CLARITIN-D 12 HOUR) 5-120 MG tablet Take 1 tablet by mouth 2 (two) times daily. 08/13/16   Ivery Quale, PA-C    Family History Family History  Problem Relation Age of Onset  . Thyroid disease Mother   . Cancer Mother     Social History Social History  Substance Use Topics  . Smoking status: Current Every Day Smoker    Packs/day: 0.50    Years: 8.00    Types: Cigarettes  . Smokeless tobacco: Never Used  . Alcohol use 2.4 oz/week    4 Cans of beer per week     Comment: occassional     Allergies   Tramadol   Review of Systems Review of Systems  Constitutional: Positive for chills and fatigue. Negative for activity change and appetite change.  HENT: Positive for congestion, ear pain, postnasal drip, sinus pressure and sore throat. Negative for ear discharge, facial  swelling, nosebleeds, rhinorrhea, sneezing and tinnitus.   Eyes: Negative for photophobia, pain and discharge.  Respiratory: Negative for cough, choking, shortness of breath and wheezing.   Cardiovascular: Negative for chest pain, palpitations and leg swelling.  Gastrointestinal: Negative for abdominal pain, blood in stool, constipation, diarrhea, nausea and vomiting.  Genitourinary: Negative for difficulty urinating, dysuria, flank pain, frequency and hematuria.  Musculoskeletal: Negative for back pain, gait problem, myalgias and neck pain.  Skin: Negative for color change, rash and wound.  Neurological: Negative for dizziness, seizures, syncope, facial asymmetry, speech difficulty, weakness and numbness.  Hematological: Negative for adenopathy. Does not bruise/bleed  easily.  Psychiatric/Behavioral: Negative for agitation, confusion, hallucinations, self-injury and suicidal ideas. The patient is not nervous/anxious.      Physical Exam Updated Vital Signs BP 128/68 (BP Location: Right Arm)   Pulse 90   Temp 98.2 F (36.8 C) (Oral)   Resp 20   Ht 5\' 9"  (1.753 m)   Wt 127 kg (280 lb)   LMP 07/24/2016   SpO2 95%   BMI 41.35 kg/m   Physical Exam  Constitutional: Vital signs are normal. She appears well-developed and well-nourished. She is active.  HENT:  Head: Normocephalic and atraumatic.  Right Ear: Tympanic membrane, external ear and ear canal normal.  Left Ear: Tympanic membrane, external ear and ear canal normal.  Mouth/Throat: Uvula is midline, oropharynx is clear and moist and mucous membranes are normal.  Nasal congestion is present. There is no pain or swelling in the mastoid area.  The left tympanic membrane is within normal limits. The right tympanic membrane shows increased fluid behind the drum. There are no pre-or postauricular nodes palpated on the right or the left.  Eyes: Conjunctivae, EOM and lids are normal. Pupils are equal, round, and reactive to light.  Neck: Trachea normal, normal range of motion and phonation normal. Neck supple. Carotid bruit is not present.  Cardiovascular: Normal rate, regular rhythm and normal pulses.   Abdominal: Soft. Normal appearance and bowel sounds are normal.  Lymphadenopathy:       Head (right side): No submental, no preauricular and no posterior auricular adenopathy present.       Head (left side): No submental, no preauricular and no posterior auricular adenopathy present.    She has no cervical adenopathy.  Neurological: She is alert. She has normal strength. No cranial nerve deficit or sensory deficit. GCS eye subscore is 4. GCS verbal subscore is 5. GCS motor subscore is 6.  Skin: Skin is warm and dry.  Psychiatric: Her speech is normal.  Vitals reviewed.    ED Treatments / Results    Labs (all labs ordered are listed, but only abnormal results are displayed) Labs Reviewed  RAPID STREP SCREEN (NOT AT [facility])  CULTURE, GROUP A STREP [facility])    EKG  EKG Interpretation None       Radiology No results found.  Procedures Procedures (including critical care time)  Medications Ordered in ED Medications  ibuprofen (ADVIL,MOTRIN) tablet 800 mg (not administered)  pseudoephedrine (SUDAFED) tablet 60 mg (not administered)     Initial Impression / Assessment and Plan / ED Course  I have reviewed the triage vital signs and the nursing notes.  Pertinent labs & imaging results that were available during my care of the patient were reviewed by me and considered in my medical decision making (see chart for details).       Final Clinical Impressions(s) / ED Diagnoses MDM Vital signs within normal limits. Strep test  is negative. The examination favors upper respiratory infection and sinusitis. Patient will be treated with Claritin-D, ibuprofen, and a short course of steroids. Patient is to follow-up with her primary physician if any changes or problems.    Final diagnoses:  Upper respiratory tract infection, unspecified type  Other sinusitis, unspecified chronicity    New Prescriptions New Prescriptions   DEXAMETHASONE (DECADRON) 4 MG TABLET    Take 1 tablet (4 mg total) by mouth 2 (two) times daily with a meal.   LORATADINE-PSEUDOEPHEDRINE (CLARITIN-D 12 HOUR) 5-120 MG TABLET    Take 1 tablet by mouth 2 (two) times daily.     Ivery QualeBryant, Trella Thurmond, PA-C 08/13/16 1339    Donnetta Hutchingook, Brian, MD 08/15/16 [phone]

## 2016-08-13 NOTE — ED Triage Notes (Signed)
PT c/o sorethroat, nose congestion x2 days with low grade fever. PT took tylenol prior to ED arrival.

## 2016-08-16 LAB — CULTURE, GROUP A STREP (THRC)

## 2017-05-29 ENCOUNTER — Encounter: Payer: Self-pay | Admitting: Family Medicine

## 2017-05-29 ENCOUNTER — Ambulatory Visit (INDEPENDENT_AMBULATORY_CARE_PROVIDER_SITE_OTHER): Payer: Self-pay | Admitting: Family Medicine

## 2017-05-29 VITALS — BP 123/73 | HR 95 | Temp 97.8°F | Ht 69.0 in | Wt 311.0 lb

## 2017-05-29 DIAGNOSIS — M722 Plantar fascial fibromatosis: Secondary | ICD-10-CM

## 2017-05-29 DIAGNOSIS — Z7689 Persons encountering health services in other specified circumstances: Secondary | ICD-10-CM

## 2017-05-29 DIAGNOSIS — N3001 Acute cystitis with hematuria: Secondary | ICD-10-CM

## 2017-05-29 LAB — URINALYSIS, COMPLETE
BILIRUBIN UA: NEGATIVE
Glucose, UA: NEGATIVE
NITRITE UA: NEGATIVE
PH UA: 7.5 (ref 5.0–7.5)
Specific Gravity, UA: 1.02 (ref 1.005–1.030)
Urobilinogen, Ur: 1 mg/dL (ref 0.2–1.0)

## 2017-05-29 LAB — MICROSCOPIC EXAMINATION

## 2017-05-29 MED ORDER — CEPHALEXIN 500 MG PO CAPS: 500.0000 mg | ORAL_CAPSULE | Freq: Four times a day (QID) | ORAL | 0 refills | Status: AC

## 2017-05-29 MED ORDER — MELOXICAM 15 MG PO TABS: 7.5000 mg | ORAL_TABLET | Freq: Every day | ORAL | 0 refills | Status: AC

## 2017-05-29 NOTE — Progress Notes (Signed)
Subjective: WU:[id number]:establish care, Foot pain HPI: Elizabeth Luna is a 32 y.o. female presenting to clinic today for:  1. Foot pain Patient reports a 5121-month history of left heel pain.  She notes that it seems to be gradually getting worse.  She reports that pain seems most significant when she gets out of bed and the morning and goes to step down or when she has rested and goes to stand up.  She denies preceding injury, numbness, tingling, discoloration.  She has tried changing shoes and getting more supportive footwear in efforts to help pain.  Nothing seems to be helping.  2.  Right flank pain Patient reports a 1-2-week history of right-sided flank pain with associated increased urinary frequency and urgency.  She denies dysuria, hematuria, fevers, chills, nausea, vomiting, abdominal pain.  She reports that this feels similar to when she had urinary tract infections in the past.  Past Medical History:  Diagnosis Date  . Bell's palsy   . GERD (gastroesophageal reflux disease)   . Morbid obesity (HCC)    Past Surgical History:  Procedure Laterality Date  . CHOLECYSTECTOMY N/A 07/03/2013   Procedure: LAPAROSCOPIC CHOLECYSTECTOMY;  Surgeon: Dalia HeadingMark A Jenkins, MD;  Location: [facility];  Service: General;  Laterality: N/A;  . FOOT SURGERY Right    removal of glass   Social History   Socioeconomic History  . Marital status: Single    Spouse name: Not on file  . Number of children: Not on file  . Years of education: Not on file  . Highest education level: Not on file  Occupational History  . Occupation: [employer]  Social Needs  . Financial resource strain: Not on file  . Food insecurity:    Worry: Not on file    Inability: Not on file  . Transportation needs:    Medical: Not on file    Non-medical: Not on file  Tobacco Use  . Smoking status: Former Smoker    Packs/day: 0.50    Years: 8.00    Pack years: 4.00    Types: Cigarettes  . Smokeless tobacco: Never Used  Substance and  Sexual Activity  . Alcohol use: Yes    Alcohol/week: 2.4 oz    Types: 4 Cans of beer per week    Comment: occassional  . Drug use: Yes    Frequency: 1.0 times per week    Types: Marijuana    Comment: 1 month  . Sexual activity: Yes    Birth control/protection: None  Lifestyle  . Physical activity:    Days per week: Not on file    Minutes per session: Not on file  . Stress: Not on file  Relationships  . Social connections:    Talks on phone: Not on file    Gets together: Not on file    Attends religious service: Not on file    Active member of club or organization: Not on file    Attends meetings of clubs or organizations: Not on file    Relationship status: Not on file  . Intimate partner violence:    Fear of current or ex partner: Not on file    Emotionally abused: Not on file    Physically abused: Not on file    Forced sexual activity: Not on file  Other Topics Concern  . Not on file  Social History Narrative  . Not on file   No outpatient medications have been marked as taking for the 05/29/17 encounter (Office Visit)  with Raliegh Ip, DO.   Family History  Problem Relation Age of Onset  . Thyroid disease Mother   . Cancer Mother   . Diabetes Mother   . Heart disease Father   . Healthy Sister   . Healthy Brother   . Healthy Brother   . Healthy Sister    Allergies  Allergen Reactions  . Tramadol Itching and Nausea Only    Health Maintenance: needs pap/ TDap ROS: Per HPI  Objective: Office vital signs reviewed. BP 123/73   Pulse 95   Temp 97.8 F (36.6 C) (Oral)   Ht 5\' 9"  (1.753 m)   Wt (!) 311 lb (141.1 kg)   LMP 05/15/2017   BMI 45.93 kg/m   Physical Examination:  General: Awake, alert, obese, No acute distress HEENT: Normal    Eyes: PERRLA, extraocular movement in tact, sclera white    Nose: nasal turbinates moist, no nasal discharge    Throat: moist mucus membranes.  Airway is patent Cardio: regular rate and rhythm, S1S2 heard, no  murmurs appreciated Pulm: clear to auscultation bilaterally, no wheezes, rhonchi or rales; normal work of breathing on room air MSK: Mild right-sided CVA tenderness to palpation.  Left foot: No gross deformities, swelling or discoloration appreciated on visual inspection.  Patient has moderate tenderness to palpation along the medial plantar surface of the calcaneus. Neuro: Light touch sensation grossly intact.  No focal neurologic deficits Psych: Mood stable, speech normal, affect appropriate, pleasant   Office Visit from 05/29/2017 in [facility]  PHQ-2 Total Score  0     Assessment/ Plan: 32 y.o. female   1. Acute cystitis with hematuria Urinalysis demonstrated trace ketones, trace intact blood, trace protein and trace leukocytes.  Urine microscopy with 6-10 white blood cells, 3-10 red blood cells, greater than 10 epithelial cells and few bacteria.  I have sent this for urine culture.  Given presence of mild CVA tenderness on the right, I have placed her on Keflex 500 mg 4 times daily for the next 7 days.  Will contact her with the results of the urine culture.  Push oral fluids.  Handout provided and return precautions discussed. - Urinalysis, Complete - Urine Culture  2. Plantar fasciitis, left Clinically consistent with plantar fasciitis.  Home care instructions were reviewed, including stretching and icing.  I have ordered meloxicam for her to use 7.5 mg to 15 mg daily as needed pain.  Take with food and plenty of water.  Return precautions reviewed.  I did inform her that Dr. Louanne Skye would be available for corticosteroid injection should symptoms not improve with conservative management.  She voiced good understanding and will schedule accordingly.  3. Morbid obesity (HCC) Patient is interested in weight loss.  She will schedule an appointment in the future with regards to this.  Healthy lifestyle choices recommended.  We will plan to obtain lipid panel, CMP, TSH  and A1c at physical exam, as patient is currently uninsured and financially limited at this time.  4. Establishing care with new doctor, encounter for Has not had a primary care doctor in several years.  We will plan to see her back for a physical exam with fasting labs as above, Pap smear and plan to administer tetanus shot at that visit.  Currently she is uninsured and wishes to defer this at this time.   Raliegh Ip, DO [facility] [phone]

## 2017-05-29 NOTE — Patient Instructions (Signed)
You have been prescribed 2 medications today:  For your heel: Meloxicam, take 1/2-1 tablet daily as needed for heel pain.  Make sure to take this with food and plenty of water.  For your urinary tract infection: Cephalexin, take 1 capsule 4 times a day for the next 7 days.  As we discussed, your heel pain is something called plantar fasciitis.  Weight loss can sometimes help reduce the stress on your heels and therefore reduce pain.  Perform the at home stretching and icing that I discussed with you.  If your symptoms do not improve or substantially worsen, call to schedule an appointment with Dr. Louanne Skyeettinger for heel injection.   Urinary Tract Infection, Adult A urinary tract infection (UTI) is an infection of any part of the urinary tract. The urinary tract includes the:  Kidneys.  Ureters.  Bladder.  Urethra.  These organs make, store, and get rid of pee (urine) in the body. Follow these instructions at home:  Take over-the-counter and prescription medicines only as told by your doctor.  If you were prescribed an antibiotic medicine, take it as told by your doctor. Do not stop taking the antibiotic even if you start to feel better.  Avoid the following drinks: ? Alcohol. ? Caffeine. ? Tea. ? Carbonated drinks.  Drink enough fluid to keep your pee clear or pale yellow.  Keep all follow-up visits as told by your doctor. This is important.  Make sure to: ? Empty your bladder often and completely. Do not to hold pee for long periods of time. ? Empty your bladder before and after sex. ? Wipe from front to back after a bowel movement if you are female. Use each tissue one time when you wipe. Contact a doctor if:  You have back pain.  You have a fever.  You feel sick to your stomach (nauseous).  You throw up (vomit).  Your symptoms do not get better after 3 days.  Your symptoms go away and then come back. Get help right away if:  You have very bad back pain.  You  have very bad lower belly (abdominal) pain.  You are throwing up and cannot keep down any medicines or water. This information is not intended to replace advice given to you by your health care provider. Make sure you discuss any questions you have with your health care provider. Document Released: 08/01/2007 Document Revised: 07/21/2015 Document Reviewed: 01/03/2015 Elsevier Interactive Patient Education  [employer]2018 [employer]

## 2017-05-31 LAB — URINE CULTURE

## 2017-06-03 ENCOUNTER — Telehealth: Payer: Self-pay | Admitting: Family Medicine

## 2017-06-03 NOTE — Telephone Encounter (Signed)
Patient aware.

## 2017-06-03 NOTE — Telephone Encounter (Signed)
She can take Alka-Seltzer, also Mucinex, Claritin, Benadryl, Flonase oral over-the-counter possibilities as well.

## 2017-06-03 NOTE — Telephone Encounter (Signed)
Patient was seen 05/29/17 and was given Keflex. States she was told that there was only certain things she could take with the abx. Patient states that she has been having a sore throat, nasal congestion and runny nose x 1 day. Denies any fever.  Please advise if it is okay for patient to take otc alka seltzer.

## 2017-07-23 ENCOUNTER — Ambulatory Visit: Payer: Self-pay | Admitting: Physician Assistant

## 2017-07-23 NOTE — Progress Notes (Signed)
Subjective: CC: UTI PCP: Elizabeth Luna, Elizabeth Radon, MD WUJ:[id number] Elizabeth Luna is a 32 y.o. female presenting to clinic today for:  1. Urinary symptoms Patient reports a several day h/o right sided flank pai .  Denies urinary frequency, urgency, hematuria, fevers, chills, abdominal pain, nausea, vomiting.  She does report beige vaginal discharge.  Denies vaginal itching, lesions.  She is in a monogamous relationship w/ 1 female partner.  Patient has used nothing for symptoms.  Patient denies a h/o frequent or recurrent UTIs.    She was last seen at the beginning of April with similar symptoms.  She was treated with Keflex for presumed UTI with possible pyelonephritis.  Urine culture actually did not grow any bacteria.  Denies any past medical history of renal stones.  Not currently on any medications except for intermittent meloxicam for plantar fasciitis, which has improved since last visit.   ROS: Per HPI  Allergies  Allergen Reactions  . Tramadol Itching and Nausea Only   Past Medical History:  Diagnosis Date  . Bell's palsy   . GERD (gastroesophageal reflux disease)   . Morbid obesity (HCC)     Current Outpatient Medications:  .  meloxicam (MOBIC) 15 MG tablet, Take 0.5-1 tablets (7.5-15 mg total) by mouth daily. (if needed for heel pain), Disp: 30 tablet, Rfl: 0 Social History   Socioeconomic History  . Marital status: Single    Spouse name: Not on file  . Number of children: Not on file  . Years of education: Not on file  . Highest education level: Not on file  Occupational History  . Occupation: Elizabeth Luna  Social Needs  . Financial resource strain: Not on file  . Food insecurity:    Worry: Not on file    Inability: Not on file  . Transportation needs:    Medical: Not on file    Non-medical: Not on file  Tobacco Use  . Smoking status: Former Smoker    Packs/day: 0.50    Years: 8.00    Pack years: 4.00    Types: Cigarettes  . Smokeless tobacco: Never Used    Substance and Sexual Activity  . Alcohol use: Yes    Alcohol/week: 2.4 oz    Types: 4 Cans of beer per week    Comment: occassional  . Drug use: Yes    Frequency: 1.0 times per week    Types: Marijuana    Comment: 1 month  . Sexual activity: Yes    Birth control/protection: None  Lifestyle  . Physical activity:    Days per week: Not on file    Minutes per session: Not on file  . Stress: Not on file  Relationships  . Social connections:    Talks on phone: Not on file    Gets together: Not on file    Attends religious service: Not on file    Active member of club or organization: Not on file    Attends meetings of clubs or organizations: Not on file    Relationship status: Not on file  . Intimate partner violence:    Fear of current or ex partner: Not on file    Emotionally abused: Not on file    Physically abused: Not on file    Forced sexual activity: Not on file  Other Topics Concern  . Not on file  Social History Narrative  . Not on file   Family History  Problem Relation Age of Onset  . Thyroid disease Mother   .  Cancer Mother   . Diabetes Mother   . Heart disease Father   . Healthy Sister   . Healthy Brother   . Healthy Brother   . Healthy Sister     Objective: Office vital signs reviewed. BP 138/85   Pulse 85   Temp (!) 97.1 F (36.2 C) (Oral)   Ht  (1.753 m)   Wt (!) 310 lb (140.6 kg)   BMI 45.78 kg/m   Physical Examination:  General: Awake, alert, obese, No acute distress MSK: mild right sided TTP to low back. No overt CVA TTP. No suprapubic TTP  Assessment/ Plan: 32 y.o. female   1. Acute cystitis with hematuria Urinalysis demonstrates trace leukocytes and 1+ blood.  She has 3-10 red blood cells on microscopy as well as moderate amounts of bacteria and trichomonas present.  This is been sent for urine culture.  This is very similar to previous presentation except for trichomonas now present.  I do question whether or not this may have  been present previously and has been the cause of her symptoms all along.  I have placed her on Keflex and Flagyl p.o. twice daily for the next 7 days.  Will contact with results of urine culture.  If she has persistent symptoms after treatment, we discussed consideration for KUB to evaluate for possible renal stone.  Patient is amenable to this.  She will have her partner treated as well.  I did offer her other STI testing but patient declined and will go to the health department to have this done since she is self-pay. - Urinalysis, Complete - Urine Culture  2. Trichomonal vaginitis Flagyl as above.   Orders Placed This Encounter  Procedures  . Urine Culture  . Urinalysis, Complete   Meds ordered this encounter  Medications  . metroNIDAZOLE (FLAGYL) 500 MG tablet    Sig: Take 1 tablet (500 mg total) by mouth 2 (two) times daily.    Dispense:  14 tablet    Refill:  0  . cephALEXin (KEFLEX) 500 MG capsule    Sig: Take 1 capsule (500 mg total) by mouth 2 (two) times daily for 7 days.    Dispense:  14 capsule    Refill:  0     Elizabeth Gladu Hulen Skains, DO [facility] [phone]

## 2017-07-24 ENCOUNTER — Ambulatory Visit (INDEPENDENT_AMBULATORY_CARE_PROVIDER_SITE_OTHER): Payer: Self-pay | Admitting: Family Medicine

## 2017-07-24 ENCOUNTER — Encounter: Payer: Self-pay | Admitting: Family Medicine

## 2017-07-24 VITALS — BP 138/85 | HR 85 | Temp 97.1°F | Ht 69.0 in | Wt 310.0 lb

## 2017-07-24 DIAGNOSIS — N3001 Acute cystitis with hematuria: Secondary | ICD-10-CM

## 2017-07-24 DIAGNOSIS — A5901 Trichomonal vulvovaginitis: Secondary | ICD-10-CM

## 2017-07-24 LAB — URINALYSIS, COMPLETE
Bilirubin, UA: NEGATIVE
Glucose, UA: NEGATIVE
KETONES UA: NEGATIVE
NITRITE UA: NEGATIVE
PH UA: 5.5 (ref 5.0–7.5)
Protein, UA: NEGATIVE
Specific Gravity, UA: 1.025 (ref 1.005–1.030)
Urobilinogen, Ur: 0.2 mg/dL (ref 0.2–1.0)

## 2017-07-24 LAB — MICROSCOPIC EXAMINATION: Epithelial Cells (non renal): >10 /hpf — AB (ref 0–10)

## 2017-07-24 MED ORDER — METRONIDAZOLE 500 MG PO TABS: 500.0000 mg | ORAL_TABLET | Freq: Two times a day (BID) | ORAL | 0 refills | Status: AC

## 2017-07-24 MED ORDER — CEPHALEXIN 500 MG PO CAPS: 500.0000 mg | ORAL_CAPSULE | Freq: Two times a day (BID) | ORAL | 0 refills | Status: AC

## 2017-07-24 NOTE — Patient Instructions (Signed)
I have sent in metronidazole for you to take twice a day for the next week to cover for the STD that we discussed, trichomonas. I have also sent in cephalexin for you to take twice a day for the next week to cover for any urinary tract infection.  Trichomoniasis Trichomoniasis is an STI (sexually transmitted infection) that can affect both women and men. In women, the outer area of the female genitalia (vulva) and the vagina are affected. In men, the penis is mainly affected, but the prostate and other reproductive organs can also be involved. This condition can be treated with medicine. It often has no symptoms (is asymptomatic), especially in men. What are the causes? This condition is caused by an organism called Trichomonas vaginalis. Trichomoniasis most often spreads from person to person (is contagious) through sexual contact. What increases the risk? The following factors may make you more likely to develop this condition:  Having unprotected sexual intercourse.  Having sexual intercourse with a partner who has trichomoniasis.  Having multiple sexual partners.  Having had previous trichomoniasis infections or other STIs.  What are the signs or symptoms? In women, symptoms of trichomoniasis include:  Abnormal vaginal discharge that is clear, white, gray, or yellow-green and foamy and has an unusual "fishy" odor.  Itching and irritation of the vagina and vulva.  Burning or pain during urination or sexual intercourse.  Genital redness and swelling.  In men, symptoms of trichomoniasis include:  Penile discharge that may be foamy or contain pus.  Pain in the penis. This may happen only when urinating.  Itching or irritation inside the penis.  Burning after urination or ejaculation.  How is this diagnosed? In women, this condition may be found during a routine Pap test or physical exam. It may be found in men during a routine physical exam. Your health care provider may  perform tests to help diagnose this infection, such as:  Urine tests (men and women).  The following in women: ? Testing the pH of the vagina. ? A vaginal swab test that checks for the Trichomonas vaginalis organism. ? Testing vaginal secretions.  Your health care provider may test you for other STIs, including HIV (human immunodeficiency virus). How is this treated? This condition is treated with medicine taken by mouth (orally), such as metronidazole or tinidazole to fight the infection. Your sexual partner(s) may also need to be tested and treated.  If you are a woman and you plan to become pregnant or think you may be pregnant, tell your health care provider right away. Some medicines that are used to treat the infection should not be taken during pregnancy.  Your health care provider may recommend over-the-counter medicines or creams to help relieve itching or irritation. You may be tested for infection again 3 months after treatment. Follow these instructions at home:  Take and use over-the-counter and prescription medicines, including creams, only as told by your health care provider.  Do not have sexual intercourse until one week after you finish your medicine, or until your health care provider approves. Ask your health care provider when you may resume sexual intercourse.  (Women) Do not douche or wear tampons while you have the infection.  Discuss your infection with your sexual partner(s). Make sure that your partner gets tested and treated, if necessary.  Keep all follow-up visits as told by your health care provider. This is important. How is this prevented?  Use condoms every time you have sex. Using condoms correctly and consistently  can help protect against STIs.  Avoid having multiple sexual partners.  Talk with your sexual partner about any symptoms that either of you may have, as well as any history of STIs.  Get tested for STIs and STDs (sexually transmitted  diseases) before you have sex. Ask your partner to do the same.  Do not have sexual contact if you have symptoms of trichomoniasis or another STI. Contact a health care provider if:  You still have symptoms after you finish your medicine.  You develop pain in your abdomen.  You have pain when you urinate.  You have bleeding after sexual intercourse.  You develop a rash.  You feel nauseous or you vomit.  You plan to become pregnant or think you may be pregnant. Summary  Trichomoniasis is an STI (sexually transmitted infection) that can affect both women and men.  This condition often has no symptoms (is asymptomatic), especially in men.  You should not have sexual intercourse until one week after you finish your medicine, or until your health care provider approves. Ask your health care provider when you may resume sexual intercourse.  Discuss your infection with your sexual partner. Make sure that your partner gets tested and treated, if necessary. This information is not intended to replace advice given to you by your health care provider. Make sure you discuss any questions you have with your health care provider. Document Released: 08/08/2000 Document Revised: 01/06/2016 Document Reviewed: 01/06/2016 Elsevier Interactive Patient Education  2017 [employer].

## 2017-07-26 LAB — URINE CULTURE

## 2017-10-26 ENCOUNTER — Encounter (HOSPITAL_COMMUNITY): Payer: Self-pay | Admitting: Emergency Medicine

## 2017-10-26 ENCOUNTER — Emergency Department (HOSPITAL_COMMUNITY)
Admission: EM | Admit: 2017-10-26 | Discharge: 2017-10-26 | Disposition: A | Discharge status: Home / Self Care | Payer: Self-pay | Attending: Emergency Medicine | Admitting: Emergency Medicine

## 2017-10-26 ENCOUNTER — Other Ambulatory Visit: Payer: Self-pay

## 2017-10-26 DIAGNOSIS — Z87891 Personal history of nicotine dependence: Secondary | ICD-10-CM | POA: Insufficient documentation

## 2017-10-26 DIAGNOSIS — G510 Bell's palsy: Secondary | ICD-10-CM | POA: Insufficient documentation

## 2017-10-26 DIAGNOSIS — M545 Low back pain, unspecified: Secondary | ICD-10-CM

## 2017-10-26 DIAGNOSIS — Z79899 Other long term (current) drug therapy: Secondary | ICD-10-CM | POA: Insufficient documentation

## 2017-10-26 LAB — URINALYSIS, ROUTINE W REFLEX MICROSCOPIC
BILIRUBIN URINE: NEGATIVE
Glucose, UA: NEGATIVE mg/dL
Hgb urine dipstick: NEGATIVE
KETONES UR: NEGATIVE mg/dL
Leukocytes, UA: NEGATIVE
Nitrite: NEGATIVE
PH: 5 (ref 5.0–8.0)
Protein, ur: NEGATIVE mg/dL
Specific Gravity, Urine: 1.024 (ref 1.005–1.030)

## 2017-10-26 LAB — PREGNANCY, URINE: Preg Test, Ur: NEGATIVE

## 2017-10-26 MED ORDER — CYCLOBENZAPRINE HCL 5 MG PO TABS: 5.0000 mg | ORAL_TABLET | Freq: Three times a day (TID) | ORAL | 0 refills | Status: AC | PRN

## 2017-10-26 MED ORDER — IBUPROFEN 600 MG PO TABS: 600.0000 mg | ORAL_TABLET | Freq: Three times a day (TID) | ORAL | 0 refills | Status: DC | PRN

## 2017-10-26 MED ORDER — CYCLOBENZAPRINE HCL 5 MG PO TABS: 5.0000 mg | ORAL_TABLET | Freq: Three times a day (TID) | ORAL | 0 refills | Status: DC | PRN

## 2017-10-26 MED ORDER — IBUPROFEN 600 MG PO TABS: 600.0000 mg | ORAL_TABLET | Freq: Three times a day (TID) | ORAL | 0 refills | Status: AC | PRN

## 2017-10-26 NOTE — ED Provider Notes (Signed)
[name] EMERGENCY DEPARTMENT Provider Note   CSN: [id number] Arrival date & time: 10/26/17  1421     History   Chief Complaint Chief Complaint  Patient presents with  . Flank Pain    HPI Elizabeth Luna is a 32 y.o. female presenting with right lower back pain described as pressure and is worsened with constant but sometimes worsened with positional changes.  She denies dysuria, hematuria, urgency, fevers, chills, abdominal pain but states her symptoms are similar to her last diagnosis of uti several months ago.  Denies h/o kidney stones, also denies urinary of stool incontinence or retention and denies vaginal discharge.  She has had no tx prior to arrival.  The history is provided by the patient.    Past Medical History:  Diagnosis Date  . Bell's palsy   . GERD (gastroesophageal reflux disease)   . Morbid obesity [facility])     Patient Active Problem List   Diagnosis Date Noted  . Plantar fasciitis, left 05/29/2017  . Morbid obesity (HCC) 05/29/2017    Past Surgical History:  Procedure Laterality Date  . CHOLECYSTECTOMY N/A 07/03/2013   Procedure: LAPAROSCOPIC CHOLECYSTECTOMY;  Surgeon: Dalia Heading, MD;  Location: [facility];  Service: General;  Laterality: N/A;  . FOOT SURGERY Right    removal of glass     OB History    Gravida      Para      Term      Preterm      AB      Living  0     SAB      TAB      Ectopic      Multiple      Live Births               Home Medications    Prior to Admission medications   Medication Sig Start Date End Date Taking? Authorizing Provider  cyclobenzaprine (FLEXERIL) 5 MG tablet Take 1 tablet (5 mg total) by mouth 3 (three) times daily as needed for muscle spasms. 10/26/17   Burgess Amor, PA-C  ibuprofen (ADVIL,MOTRIN) 600 MG tablet Take 1 tablet (600 mg total) by mouth every 8 (eight) hours as needed for moderate pain. 10/26/17   Burgess Amor, PA-C  meloxicam (MOBIC) 15 MG tablet Take 0.5-1 tablets (7.5-15 mg  total) by mouth daily. (if needed for heel pain) Patient not taking: Reported on 07/24/2017 05/29/17   Raliegh Ip, DO  metroNIDAZOLE (FLAGYL) 500 MG tablet Take 1 tablet (500 mg total) by mouth 2 (two) times daily. 07/24/17   Raliegh Ip, DO    Family History Family History  Problem Relation Age of Onset  . Thyroid disease Mother   . Cancer Mother   . Diabetes Mother   . Heart disease Father   . Healthy Sister   . Healthy Brother   . Healthy Brother   . Healthy Sister     Social History Social History   Tobacco Use  . Smoking status: Former Smoker    Packs/day: 0.50    Years: 8.00    Pack years: 4.00    Types: Cigarettes  . Smokeless tobacco: Never Used  Substance Use Topics  . Alcohol use: Yes    Alcohol/week: 4.0 standard drinks    Types: 4 Cans of beer per week    Comment: occassional  . Drug use: Yes    Frequency: 1.0 times per week    Types: Marijuana    Comment:  occasionally     Allergies   Tramadol   Review of Systems Review of Systems  Constitutional: Negative for chills and fever.  Respiratory: Negative for shortness of breath.   Cardiovascular: Negative for chest pain and leg swelling.  Gastrointestinal: Negative for abdominal distention, abdominal pain, constipation, nausea and vomiting.  Genitourinary: Negative for difficulty urinating, dysuria, flank pain, frequency, hematuria, pelvic pain, urgency and vaginal discharge.  Musculoskeletal: Positive for back pain. Negative for gait problem and joint swelling.  Neurological: Negative for weakness and numbness.     Physical Exam Updated Vital Signs BP (!) 142/78 (BP Location: Right Arm)   Pulse 89   Temp 98.1 F (36.7 C) (Oral)   Resp 14   Ht 5\' 7"  (1.702 m)   Wt 131.1 kg   LMP 10/23/2017 (Exact Date)   SpO2 100%   BMI 45.26 kg/m   Physical Exam  Constitutional: She appears well-developed and well-nourished.  HENT:  Head: Normocephalic.  Eyes: Conjunctivae are normal.    Neck: Normal range of motion. Neck supple.  Cardiovascular: Normal rate and intact distal pulses.  Pedal pulses normal.  Pulmonary/Chest: Effort normal.  Abdominal: Soft. Bowel sounds are normal. She exhibits no distension and no mass.  Musculoskeletal: Normal range of motion. She exhibits no edema.       Lumbar back: She exhibits tenderness. She exhibits no swelling, no edema and no spasm.  No ttp right back or flank.  No cva tenderness.  Neurological: She is alert. She has normal strength. She displays no atrophy and no tremor. No sensory deficit. Gait normal.  Reflex Scores:      Patellar reflexes are 2+ on the right side and 2+ on the left side. No strength deficit noted in hip and knee flexor and extensor muscle groups.  Ankle flexion and extension intact.  Skin: Skin is warm and dry.  Psychiatric: She has a normal mood and affect.  Nursing note and vitals reviewed.    ED Treatments / Results  Labs (all labs ordered are listed, but only abnormal results are displayed) Labs Reviewed  URINALYSIS, ROUTINE W REFLEX MICROSCOPIC  PREGNANCY, URINE    EKG None  Radiology No results found.  Procedures Procedures (including critical care time)  Medications Ordered in ED Medications - No data to display   Initial Impression / Assessment and Plan / ED Course  I have reviewed the triage vital signs and the nursing notes.  Pertinent labs & imaging results that were available during my care of the patient were reviewed by me and considered in my medical decision making (see chart for details).     Urinalysis normal, no acute infection.  Favor musculoskeletal source of pain.  Ibuprofen, flexeril, heat tx.  Prn f/u with pcp if sx persist or worsen.  No neuro deficit on exam or by history to suggest emergent or surgical presentation.  Discussed worsened sx that should prompt immediate re-evaluation including distal weakness, bowel/bladder retention/incontinence.     '  Final  Clinical Impressions(s) / ED Diagnoses   Final diagnoses:  Acute right-sided low back pain without sciatica    ED Discharge Orders         Ordered    ibuprofen (ADVIL,MOTRIN) 600 MG tablet  Every 8 hours PRN     10/26/17 1539    cyclobenzaprine (FLEXERIL) 5 MG tablet  3 times daily PRN     10/26/17 1539           Burgess Amordol, Leovanni Bjorkman, PA-C 10/26/17 2321  Vanetta Mulders, MD 10/27/17 1024

## 2017-10-26 NOTE — Discharge Instructions (Addendum)
Avoid lifting,  Bending,  twisting or any other activity that worsens your pain over the next week.  Apply a heating pad to your back for 20 minutes several times daily.  You should get rechecked if your symptoms are not better over the next 5 days,  Or you develop increased pain,  Weakness in your leg(s) or loss of bladder or bowel function - these are symptoms of a worsening problem.  You do not have a uti as discussed.  Avoid lifting, bending or any activity that worsens your pain.

## 2017-10-26 NOTE — ED Triage Notes (Signed)
Pt c/o RT sided flank pain, change in vaginal d/c, and feeling like she's not emptying bladder x 1 week. Denies fever. States this typically happens when she has an UTI.

## 2019-07-30 ENCOUNTER — Other Ambulatory Visit: Payer: Self-pay

## 2019-07-30 ENCOUNTER — Encounter (HOSPITAL_COMMUNITY): Payer: Self-pay | Admitting: Emergency Medicine

## 2019-07-30 ENCOUNTER — Emergency Department (HOSPITAL_COMMUNITY)
Admission: EM | Admit: 2019-07-30 | Discharge: 2019-07-30 | Disposition: A | Discharge status: Home / Self Care | Payer: Self-pay | Attending: Emergency Medicine | Admitting: Emergency Medicine

## 2019-07-30 DIAGNOSIS — J029 Acute pharyngitis, unspecified: Secondary | ICD-10-CM | POA: Insufficient documentation

## 2019-07-30 DIAGNOSIS — H6690 Otitis media, unspecified, unspecified ear: Secondary | ICD-10-CM | POA: Insufficient documentation

## 2019-07-30 MED ORDER — MAGIC MOUTHWASH W/LIDOCAINE: 5.0000 mL | Freq: Three times a day (TID) | ORAL | 0 refills | Status: AC | PRN

## 2019-07-30 MED ORDER — AMOXICILLIN 500 MG PO CAPS: 500.0000 mg | ORAL_CAPSULE | Freq: Three times a day (TID) | ORAL | 0 refills | Status: AC

## 2019-07-30 NOTE — Discharge Instructions (Addendum)
Take the antibiotic as directed until it is finished.  You may take Tylenol every 4 hours if needed for pain and/or fever.  Follow-up with your primary provider for recheck.

## 2019-07-30 NOTE — ED Provider Notes (Signed)
[facility] EMERGENCY DEPARTMENT Provider Note   CSN: [id number] Arrival date & time: 07/30/19  1734     History Chief Complaint  Patient presents with  . Otalgia    Elizabeth Luna is a 34 y.o. female.  HPI     Elizabeth Luna is a 34 y.o. female who presents to the Emergency Department complaining of bilateral ear pain for one week.  Symptoms have been associated with mild nasal congestion as well.  She describes a pressure and fullness to her ears that is constant.  mild sore throat. She denies cough, chest pain, and shortness of breath.  No fever or chills, headache.  No known covid exposures, diarrhea or loss or taste or smell.    Past Medical History:  Diagnosis Date  . Bell's palsy   . GERD (gastroesophageal reflux disease)   . Morbid obesity [facility])     Patient Active Problem List   Diagnosis Date Noted  . Plantar fasciitis, left 05/29/2017  . Morbid obesity (HCC) 05/29/2017    Past Surgical History:  Procedure Laterality Date  . CHOLECYSTECTOMY N/A 07/03/2013   Procedure: LAPAROSCOPIC CHOLECYSTECTOMY;  Surgeon: Dalia Heading, MD;  Location: [facility];  Service: General;  Laterality: N/A;  . FOOT SURGERY Right    removal of glass     OB History    Gravida      Para      Term      Preterm      AB      Living  0     SAB      TAB      Ectopic      Multiple      Live Births              Family History  Problem Relation Age of Onset  . Thyroid disease Mother   . Cancer Mother   . Diabetes Mother   . Heart disease Father   . Healthy Sister   . Healthy Brother   . Healthy Brother   . Healthy Sister     Social History   Tobacco Use  . Smoking status: Former Smoker    Packs/day: 0.50    Years: 8.00    Pack years: 4.00    Types: Cigarettes  . Smokeless tobacco: Never Used  Substance Use Topics  . Alcohol use: Yes    Alcohol/week: 4.0 standard drinks    Types: 4 Cans of beer per week    Comment: occassional  . Drug use: Yes   Frequency: 1.0 times per week    Types: Marijuana    Comment: occasionally    Home Medications Prior to Admission medications   Medication Sig Start Date End Date Taking? Authorizing Provider  amoxicillin (AMOXIL) 500 MG capsule Take 1 capsule (500 mg total) by mouth 3 (three) times daily. 07/30/19   Empress Newmann, PA-C  cyclobenzaprine (FLEXERIL) 5 MG tablet Take 1 tablet (5 mg total) by mouth 3 (three) times daily as needed for muscle spasms. 10/26/17   Burgess Amor, PA-C  ibuprofen (ADVIL,MOTRIN) 600 MG tablet Take 1 tablet (600 mg total) by mouth every 8 (eight) hours as needed for moderate pain. 10/26/17   Burgess Amor, PA-C  magic mouthwash w/lidocaine SOLN Take 5 mLs by mouth 3 (three) times daily as needed for mouth pain. Swish and spit, do not swallow 07/30/19   Deari Sessler, PA-C  meloxicam (MOBIC) 15 MG tablet Take 0.5-1 tablets (7.5-15 mg total) by mouth  daily. (if needed for heel pain) Patient not taking: Reported on 07/24/2017 05/29/17   Raliegh Ip, DO  metroNIDAZOLE (FLAGYL) 500 MG tablet Take 1 tablet (500 mg total) by mouth 2 (two) times daily. 07/24/17   Raliegh Ip, DO    Allergies    Tramadol  Review of Systems   Review of Systems  Constitutional: Negative for activity change, appetite change, chills and fever.  HENT: Positive for congestion, ear pain and sore throat. Negative for ear discharge, facial swelling, rhinorrhea and trouble swallowing.   Eyes: Negative for visual disturbance.  Respiratory: Negative for cough, shortness of breath, wheezing and stridor.   Gastrointestinal: Negative for nausea and vomiting.  Musculoskeletal: Negative for neck pain and neck stiffness.  Skin: Negative for rash.  Neurological: Negative for dizziness, weakness, numbness and headaches.  Hematological: Negative for adenopathy.  Psychiatric/Behavioral: Negative for confusion.    Physical Exam Updated Vital Signs BP (!) 145/107   Pulse 93   Temp 98.5 F (36.9 C)  (Oral)   Resp 18   SpO2 100%   Physical Exam Vitals and nursing note reviewed.  Constitutional:      General: She is not in acute distress.    Appearance: Normal appearance.  HENT:     Ears:     Comments: Mild erythema of the bilateral TM's with left greater than right.  No bulging.  No edema of the canal's    Mouth/Throat:     Mouth: Mucous membranes are moist.     Pharynx: Posterior oropharyngeal erythema present.     Comments: Mild erythema, no edema or exudates.  Uvula is midline, non-edematous.   Cardiovascular:     Rate and Rhythm: Normal rate and regular rhythm.  Pulmonary:     Effort: Pulmonary effort is normal. No respiratory distress.     Breath sounds: Normal breath sounds. No wheezing.  Musculoskeletal:        General: Normal range of motion.     Cervical back: No rigidity or tenderness.  Lymphadenopathy:     Cervical: No cervical adenopathy.  Skin:    General: Skin is warm.     Capillary Refill: Capillary refill takes less than 2 seconds.     Findings: No rash.  Neurological:     General: No focal deficit present.     Mental Status: She is alert.     Sensory: No sensory deficit.     Motor: No weakness.     ED Results / Procedures / Treatments   Labs (all labs ordered are listed, but only abnormal results are displayed) Labs Reviewed - No data to display  EKG None  Radiology No results found.  Procedures Procedures (including critical care time)  Medications Ordered in ED Medications - No data to display  ED Course  I have reviewed the triage vital signs and the nursing notes.  Pertinent labs & imaging results that were available during my care of the patient were reviewed by me and considered in my medical decision making (see chart for details).    MDM Rules/Calculators/A&P                     Pt here with bilateral ear pain.  No bulging of the TM's.  Likely left OM.  She is well appearing, non-toxic.  Gait steady, no dizziness, headache or  neck pain. Mild erythema of the throat.  doubt strep.  Will treat with abx, and she agrees to out pt f/u if  needed.   On disposition, patient noted to have elevated blood pressure.  Denies associated symptoms.  No history of hypertension.  Have discussed the importance of having this rechecked by her primary care provider.  Patient agrees to plan.  Return precautions discussed.   Final Clinical Impression(s) / ED Diagnoses Final diagnoses:  Acute otitis media, unspecified otitis media type    Rx / DC Orders ED Discharge Orders         Ordered    amoxicillin (AMOXIL) 500 MG capsule  3 times daily     07/30/19 2155    magic mouthwash w/lidocaine SOLN  3 times daily PRN     07/30/19 2155           Kem Parkinson, PA-C 08/01/19 1515    Milton Ferguson, MD 08/01/19 [phone]

## 2019-07-30 NOTE — ED Triage Notes (Signed)
Pt c/o of bilateral ear pain x 1 week
